# Patient Record
Sex: Male | Born: 1970 | Race: White | Hispanic: No | Marital: Married | Smoking: Former smoker
Health system: Southern US, Community
[De-identification: ages and names within clinical notes are randomized; demographics above are authoritative.]

## PROBLEM LIST (undated history)

## (undated) DIAGNOSIS — I77810 Thoracic aortic ectasia: Secondary | ICD-10-CM

## (undated) DIAGNOSIS — K219 Gastro-esophageal reflux disease without esophagitis: Secondary | ICD-10-CM

## (undated) DIAGNOSIS — I471 Supraventricular tachycardia, unspecified: Secondary | ICD-10-CM

## (undated) DIAGNOSIS — I10 Essential (primary) hypertension: Secondary | ICD-10-CM

## (undated) DIAGNOSIS — I2510 Atherosclerotic heart disease of native coronary artery without angina pectoris: Secondary | ICD-10-CM

## (undated) HISTORY — DX: Supraventricular tachycardia: I47.1

## (undated) HISTORY — DX: Thoracic aortic ectasia: I77.810

## (undated) HISTORY — DX: Supraventricular tachycardia, unspecified: I47.10

## (undated) HISTORY — DX: Atherosclerotic heart disease of native coronary artery without angina pectoris: I25.10

## (undated) HISTORY — DX: Essential (primary) hypertension: I10

## (undated) HISTORY — DX: Gastro-esophageal reflux disease without esophagitis: K21.9

---

## 2014-01-18 ENCOUNTER — Ambulatory Visit: Payer: Self-pay | Admitting: Physician Assistant

## 2015-02-21 ENCOUNTER — Encounter: Payer: Self-pay | Admitting: Family Medicine

## 2015-02-21 ENCOUNTER — Ambulatory Visit (INDEPENDENT_AMBULATORY_CARE_PROVIDER_SITE_OTHER): Admitting: Family Medicine

## 2015-02-21 VITALS — BP 128/88 | HR 73 | Temp 98.1°F | Resp 16 | Ht 68.0 in | Wt 237.0 lb

## 2015-02-21 DIAGNOSIS — K219 Gastro-esophageal reflux disease without esophagitis: Secondary | ICD-10-CM

## 2015-02-21 DIAGNOSIS — Z23 Encounter for immunization: Secondary | ICD-10-CM | POA: Diagnosis not present

## 2015-02-21 DIAGNOSIS — Z8679 Personal history of other diseases of the circulatory system: Secondary | ICD-10-CM | POA: Insufficient documentation

## 2015-02-21 DIAGNOSIS — J0111 Acute recurrent frontal sinusitis: Secondary | ICD-10-CM | POA: Diagnosis not present

## 2015-02-21 DIAGNOSIS — I471 Supraventricular tachycardia: Secondary | ICD-10-CM

## 2015-02-21 MED ORDER — BENZONATATE 100 MG PO CAPS: 100.0000 mg | ORAL_CAPSULE | Freq: Three times a day (TID) | ORAL | Status: DC | PRN

## 2015-02-21 MED ORDER — ESOMEPRAZOLE MAGNESIUM 40 MG PO CPDR: 40.0000 mg | DELAYED_RELEASE_CAPSULE | Freq: Two times a day (BID) | ORAL | Status: DC

## 2015-02-21 MED ORDER — AMOXICILLIN-POT CLAVULANATE 875-125 MG PO TABS: 1.0000 | ORAL_TABLET | Freq: Two times a day (BID) | ORAL | Status: DC

## 2015-02-21 NOTE — Progress Notes (Signed)
Subjective:    Patient ID: Damon Moore, male    DOB: Dec 10, 1970, 44 y.o.   MRN: [id number]  HPI: Damon Snooketer Crichlow is a 44 y.o. male presenting on 02/21/2015 for Sinusitis   HPI  Pt presents for sinus congestion. Symptoms began about 3 weeks ago. He was taking OTC medications- sudafed, cold medicine, afrin. 6 days ago symptoms began to interrupt his sleep and move to chest.  Worst symptom is head congestion. Pt is experiencing drainage coming from his throat causing him to cough.   Pt reports history of SVT. He is not able to take sudafed.   Desiring refills of GERD medication today. Occasional symptoms when not on medication. No N/V regurg or belching.   Past Medical History  Diagnosis Date  . GERD (gastroesophageal reflux disease)   . Paroxysmal SVT (supraventricular tachycardia) (HCC) 2008    No current outpatient prescriptions on file prior to visit.   No current facility-administered medications on file prior to visit.    Review of Systems  Constitutional: Negative for fever and chills.  HENT: Positive for congestion and sinus pressure. Negative for ear discharge, ear pain, facial swelling, rhinorrhea and sore throat.   Respiratory: Positive for cough and chest tightness. Negative for shortness of breath and wheezing.   Gastrointestinal: Positive for nausea. Negative for vomiting.  Musculoskeletal: Negative for myalgias and neck stiffness.  Neurological: Negative for dizziness and seizures.   Per HPI unless specifically indicated above     Objective:    BP 128/88 mmHg  Pulse 73  Temp(Src) 98.1 F (36.7 C) (Oral)  Resp 16  Ht 5\' 8"  (1.727 m)  Wt 237 lb (107.502 kg)  BMI 36.04 kg/m2  SpO2 97%  Wt Readings from Last 3 Encounters:  02/21/15 237 lb (107.502 kg)    Physical Exam  Constitutional: He appears well-developed and well-nourished. No distress.  HENT:  Head: Normocephalic and atraumatic.  Right Ear: Hearing and ear canal normal. Tympanic membrane is not  erythematous and not bulging.  Left Ear: Hearing and ear canal normal. Tympanic membrane is not erythematous and not bulging.  Nose: Mucosal edema and rhinorrhea present. No sinus tenderness or nasal septal hematoma. Right sinus exhibits maxillary sinus tenderness and frontal sinus tenderness. Left sinus exhibits no maxillary sinus tenderness and no frontal sinus tenderness.  Mouth/Throat: Uvula is midline and mucous membranes are normal. Uvula swelling present. Posterior oropharyngeal erythema present. No posterior oropharyngeal edema.  Fluid and airbubbles behind R TM.    Neck: Normal range of motion. Neck supple. No Brudzinski's sign and no Kernig's sign noted.  Cardiovascular: Normal rate, regular rhythm and normal heart sounds.   Pulmonary/Chest: Breath sounds normal. No accessory muscle usage. No tachypnea. No respiratory distress.  Lymphadenopathy:    He has no cervical adenopathy.   No results found for this or any previous visit.    Assessment & Plan:   Problem List Items Addressed This Visit      Cardiovascular and Mediastinum   Paroxysmal SVT (supraventricular tachycardia) (HCC)    Other Visit Diagnoses    Acute recurrent frontal sinusitis    -  Primary    Treat for sinus infection given duration of symptoms. Augmentin BID. Supportive care at home. Advised to avoid sudafed given history of SVT.     Relevant Medications    fluticasone (FLONASE) 50 MCG/ACT nasal spray    amoxicillin-clavulanate (AUGMENTIN) 875-125 MG tablet    benzonatate (TESSALON) 100 MG capsule    Need for influenza vaccination  Relevant Orders    Flu Vaccine QUAD 36+ mos PF IM (Fluarix & Fluzone Quad PF) (Completed)    Gastroesophageal reflux disease without esophagitis        Refills sent to pharmacy.     Relevant Medications    esomeprazole (NEXIUM) 40 MG capsule       Meds ordered this encounter  Medications  . fluticasone (FLONASE) 50 MCG/ACT nasal spray    Sig:   .  amoxicillin-clavulanate (AUGMENTIN) 875-125 MG tablet    Sig: Take 1 tablet by mouth 2 (two) times daily.    Dispense:  20 tablet    Refill:  0    Order Specific Question:  Supervising Provider    Answer:  Janeann Forehand [phone]  . benzonatate (TESSALON) 100 MG capsule    Sig: Take 1 capsule (100 mg total) by mouth 3 (three) times daily as needed for cough.    Dispense:  30 capsule    Refill:  0    Order Specific Question:  Supervising Provider    Answer:  Janeann Forehand [[id number]]  . DISCONTD: esomeprazole (NEXIUM) 40 MG capsule    Sig: Take 1 capsule (40 mg total) by mouth 2 (two) times daily before a meal.    Dispense:  180 capsule    Refill:  3    Order Specific Question:  Supervising Provider    Answer:  Janeann Forehand [phone]  . esomeprazole (NEXIUM) 40 MG capsule    Sig: Take 1 capsule (40 mg total) by mouth 2 (two) times daily before a meal.    Dispense:  180 capsule    Refill:  3    Order Specific Question:  Supervising Provider    Answer:  Janeann Forehand [[id number]]      Follow up plan: Return if symptoms worsen or fail to improve.

## 2015-02-21 NOTE — Patient Instructions (Addendum)
Your symptoms are consistent with a viral upper respiratory infection and sinus infection. Take augmentin twice daily for 10 days.   You can use supportive care at home to help with your symptoms. I can use Mucinex DM  to help break up the congestion and soothe your cough. You can takes this twice daily.  I have also sent tesslon perles to your pharmacy to help with the cough- you can take these 3 times daily as needed. Honey is a natural cough suppressant- so add it to your tea in the morning.  If you have a humidifer, set that up in your bedroom at night.   Saline spray with help irrigate your sinuses. Use afrin for 3 days and 3 days only.

## 2015-05-31 ENCOUNTER — Other Ambulatory Visit: Payer: Self-pay | Admitting: Family Medicine

## 2015-11-26 ENCOUNTER — Encounter: Payer: Self-pay | Admitting: Family Medicine

## 2017-01-16 ENCOUNTER — Ambulatory Visit (INDEPENDENT_AMBULATORY_CARE_PROVIDER_SITE_OTHER)

## 2017-01-16 DIAGNOSIS — Z23 Encounter for immunization: Secondary | ICD-10-CM | POA: Diagnosis not present

## 2017-10-20 ENCOUNTER — Ambulatory Visit (INDEPENDENT_AMBULATORY_CARE_PROVIDER_SITE_OTHER): Admitting: Family Medicine

## 2017-10-20 ENCOUNTER — Ambulatory Visit: Admitting: Nurse Practitioner

## 2017-10-20 ENCOUNTER — Other Ambulatory Visit: Payer: Self-pay | Admitting: Family Medicine

## 2017-10-20 ENCOUNTER — Encounter: Payer: Self-pay | Admitting: Family Medicine

## 2017-10-20 VITALS — BP 141/76 | HR 63 | Temp 98.1°F | Resp 16 | Ht 68.0 in | Wt 236.0 lb

## 2017-10-20 DIAGNOSIS — L308 Other specified dermatitis: Secondary | ICD-10-CM

## 2017-10-20 DIAGNOSIS — Z13220 Encounter for screening for lipoid disorders: Secondary | ICD-10-CM

## 2017-10-20 DIAGNOSIS — L570 Actinic keratosis: Secondary | ICD-10-CM

## 2017-10-20 DIAGNOSIS — R21 Rash and other nonspecific skin eruption: Secondary | ICD-10-CM

## 2017-10-20 DIAGNOSIS — K219 Gastro-esophageal reflux disease without esophagitis: Secondary | ICD-10-CM

## 2017-10-20 DIAGNOSIS — Z0000 Encounter for general adult medical examination without abnormal findings: Secondary | ICD-10-CM

## 2017-10-20 DIAGNOSIS — E669 Obesity, unspecified: Secondary | ICD-10-CM

## 2017-10-20 DIAGNOSIS — Z114 Encounter for screening for human immunodeficiency virus [HIV]: Secondary | ICD-10-CM

## 2017-10-20 DIAGNOSIS — L309 Dermatitis, unspecified: Secondary | ICD-10-CM | POA: Insufficient documentation

## 2017-10-20 DIAGNOSIS — Z131 Encounter for screening for diabetes mellitus: Secondary | ICD-10-CM

## 2017-10-20 MED ORDER — PANTOPRAZOLE SODIUM 40 MG PO TBEC: 40.0000 mg | DELAYED_RELEASE_TABLET | Freq: Two times a day (BID) | ORAL | 3 refills | Status: DC

## 2017-10-20 MED ORDER — CLOTRIMAZOLE-BETAMETHASONE 1-0.05 % EX CREA: 1.0000 "application " | TOPICAL_CREAM | Freq: Two times a day (BID) | CUTANEOUS | 2 refills | Status: DC

## 2017-10-20 NOTE — Assessment & Plan Note (Signed)
Suspected chronic GERD with known triggers - suboptimally controlled - No GI red flag symptoms. Exam is unremarkable with benign abdomen - History not suggestive of PUD but concern for other underlying factors that contribute to refractory symptoms for long-term - Previously better controlled on brand Nexium  Plan: 1. Discontinue esomeprazole - Start rx Pantoprazole 40mg  BID prior to meals 2. Diet modifications reduce GERD 3. Future consider carafate or other OTC antacid 4. Follow-up in future - advised he would benefit from further GI work-up in future back at [facility] if he prefers - may need EGD updated given chronicity and severity of problem

## 2017-10-20 NOTE — Assessment & Plan Note (Signed)
Consistent with moderate localized atopic dermatitis, eczema vs possible tinea cruris fungal rash on back given trigger w/ hot/moist environment - Uncomplicated, no evidence of superinfection or extensive body coverage. - Improved on Betamethasone in past  Plan: 1. Rx topical betamethasone + clotrimazole (Lotrisone) cream for back rash BID 7-10 days then PRN, if not as effective can notify office and we can switch to monotherapy betamethasone or other topical steroid 2. May use daily moisturizer BID 3. Counseled on routine eczema prevention/skin care per AVS 4. Return criteria - may establish with Dermatology in future

## 2017-10-20 NOTE — Patient Instructions (Addendum)
Thank you for coming to the office today.  For Acid Reflux - We will switch off of the Esomeprazole  Read about Actinic Keratoses  Recommend follow-up with Dermatology for likely freezing or cryotherapy for pre-cancer  --------------------------------  The rash looks most consistent with eczema, this can flare up and get worse due to a variety of factors (excessive dry skin from bathing/showering, soaps, cold weather / indoor heaters, outdoor exposures).  Use the topical steroid creams twice a day for up to 1 week, maximum duration of use per one flare is 10 to 14 days, then STOP using it and allow skin to recover. Caution with over-use may cause lightening of the skin.   Hydrocortisone on face only and the Triamcinolone / Kenalog on body only.  Tips to reduce Eczema Flares: For baths/showers, limit bathing to every other day if you can (max 1 x daily)  Use a gentle, unscented soap and lukewarm water (hot water is most irritating to skin) Never scrub skin with too much pressure, this causes more irritation. Pat skin dry, then leave it slightly damp. DO NOT scrub it dry. Apply steroid cream to skin and rub in all the way, wait 15 min, then apply a daily moisturizer (Vaseline, Eucerin, Aveeno). Continue daily moisturizer every day of the year (even after flare is resolved) - If you have eczema on hands or dry hands, recommend wearing any type of gloves overnight (cloth, fabric, or even nitrile/latex) to improve effect of topical moisturizer  We sent combo cream with anti fungal as well - follow instructions, call if not improving and need to switch to other med  If develops redness, honey colored crust oozing, drainage of pus, bleeding, or redness / swelling, pain, please return for re-evaluation, may have become infected after scratching.  ---------------------  Please call your Medicare Insurance Plan to speak with benefits and ask what the cost and coverage is for a "Annual Physical"  (performed by your doctor)  DUE for FASTING BLOOD WORK (no food or drink after midnight before the lab appointment, only water or coffee without cream/sugar on the morning of)  SCHEDULE "Lab Only" visit in the morning at the clinic for lab draw in 5 MONTHS   - Make sure Lab Only appointment is at about 1 week before your next appointment, so that results will be available  For Lab Results, once available within 2-3 days of blood draw, you can can log in to MyChart online to view your results and a brief explanation. Also, we can discuss results at next follow-up visit.   Please schedule a Follow-up Appointment to: Return in about 5 months (around 03/22/2018) for Annual Physical.  If you have any other questions or concerns, please feel free to call the office or send a message through MyChart. You may also schedule an earlier appointment if necessary.  Additionally, you may be receiving a survey about your experience at our office within a few days to 1 week by e-mail or mail. We value your feedback.  Saralyn PilarAlexander Rashun Grattan, DO [facility]

## 2017-10-20 NOTE — Assessment & Plan Note (Signed)
Consistent with AK on L upper helix ear, other areas likely face/scalp High risk patient with fair skin and inc sun exposure No prior known skin CA. But fam  History of father w/ skin cancer uncertain type Recommend that he follow-up with Dermatology - he states does not need referral - will benefit from skin surveillance and cryotherapy

## 2017-10-20 NOTE — Progress Notes (Signed)
Subjective:    Patient ID: Damon Snooketer Hensler, male    DOB: 1971-03-31, 47 y.o.   MRN: [id number]  Damon Moore is a 47 y.o. male presenting on 10/20/2017 for Gastroesophageal Reflux (needs refill) and heat rash (onset 3 weeks )  Patient previously followed by prior PCP Dr Juanetta GoslingHawkins here. Now he is re-establishing with me as new PCP. He also has received care at the [facility].  HPI  RASH, Back Here for recurrent chronic problem. Previously treated by prior PCP with topical Betamethasone cream on back with good results, usually problem recurs with dry itchy skin. He attributes to moist environment of skin on his back, he works as a Emergency planning/management officerpolice officer, [employer], he was wearing heavy vest recently and was sweating. Onset about 3 weeks now, not improving, worse with certain clothing, worse with sweating and different circumstance. Primarily itching, without pain He takes very hot showers usually and does scrub dry with towel after. Denies any fevers, chills, sweats, other rash or drainage  GERD Chronic problem, has been treated in past by [facility]. He was treated with Nexium for past 10 years. He was treated with PPI long term high dose. He was taking brand name Nexium 40mg  BID in past, now they do not cover it. [facility] had barium swallow unsure if he had EGD - He knows his trigger foods, and limits these factors mostly tomato and spicey foods. - He is interested to try other PPI at this time Denies abdominal pain, nausea vomiting, dark stool blood in stool  Additional complaint - Actinic Keratoses - reports concern with few spots on face and Left ear with rough patch that does not resolve present for long time. Asking for 2nd opinion and if need to go to Dermatology   [facility]/2017  Decreased Interest 0  Down, Depressed, Hopeless 0  PHQ - 2 Score 0    Social History   Tobacco Use  . Smoking status: Former Smoker    Last attempt to quit: 04/07/2009    Years  since quitting: 8.5  . Smokeless tobacco: Former Engineer, waterUser  Substance Use Topics  . Alcohol use: Yes    Comment: rare  . Drug use: No    Review of Systems Per HPI unless specifically indicated above     Objective:    BP (!) 141/76   Pulse 63   Temp 98.1 F (36.7 C) (Oral)   Resp 16   Ht 5\' 8"  (1.727 m)   Wt 236 lb (107 kg)   BMI 35.88 kg/m   Wt Readings from Last 3 Encounters:  10/20/17 236 lb (107 kg)  02/21/15 237 lb (107.5 kg)    Physical Exam  Constitutional: He is oriented to person, place, and time. He appears well-developed and well-nourished. No distress.  Well-appearing, comfortable, cooperative  HENT:  Head: Normocephalic and atraumatic.  Mouth/Throat: Oropharynx is clear and moist.  Eyes: Conjunctivae are normal. Right eye exhibits no discharge. Left eye exhibits no discharge.  Cardiovascular: Normal rate.  Pulmonary/Chest: Effort normal.  Musculoskeletal: He exhibits no edema.  Neurological: He is alert and oriented to person, place, and time.  Skin: Skin is warm and dry. Rash (see picture, extensive on back but mostly R sided upper fine rough erythematous maculopapular rash with scaley dry areas) noted. He is not diaphoretic. No erythema.  Left Ear - on top of helix has 1-2 cm area of very dry rough skin, without ulceration,  consistent with AK  Fair skin with evidence of sun exposure  Psychiatric: He has a normal mood and affect. His behavior is normal.  Well groomed, good eye contact, normal speech and thoughts  Nursing note and vitals reviewed.   Right upper back      No results found for this or any previous visit.    Assessment & Plan:   Problem List Items Addressed This Visit    AK (actinic keratosis)    Consistent with AK on L upper helix ear, other areas likely face/scalp High risk patient with fair skin and inc sun exposure No prior known skin CA. But fam  History of father w/ skin cancer uncertain type Recommend that he follow-up with  Dermatology - he states does not need referral - will benefit from skin surveillance and cryotherapy      Eczema    Consistent with moderate localized atopic dermatitis, eczema vs possible tinea cruris fungal rash on back given trigger w/ hot/moist environment - Uncomplicated, no evidence of superinfection or extensive body coverage. - Improved on Betamethasone in past  Plan: 1. Rx topical betamethasone + clotrimazole (Lotrisone) cream for back rash BID 7-10 days then PRN, if not as effective can notify office and we can switch to monotherapy betamethasone or other topical steroid 2. May use daily moisturizer BID 3. Counseled on routine eczema prevention/skin care per AVS 4. Return criteria - may establish with Dermatology in future      Relevant Medications   clotrimazole-betamethasone (LOTRISONE) cream   Gastroesophageal reflux disease - Primary    Suspected chronic GERD with known triggers - suboptimally controlled - No GI red flag symptoms. Exam is unremarkable with benign abdomen - History not suggestive of PUD but concern for other underlying factors that contribute to refractory symptoms for long-term - Previously better controlled on brand Nexium  Plan: 1. Discontinue esomeprazole - Start rx Pantoprazole 40mg  BID prior to meals 2. Diet modifications reduce GERD 3. Future consider carafate or other OTC antacid 4. Follow-up in future - advised he would benefit from further GI work-up in future back at [place] if he prefers - may need EGD updated given chronicity and severity of problem       Relevant Medications   pantoprazole (PROTONIX) 40 MG tablet    Other Visit Diagnoses    Rash and nonspecific skin eruption       Relevant Medications   clotrimazole-betamethasone (LOTRISONE) cream      Meds ordered this encounter  Medications  . pantoprazole (PROTONIX) 40 MG tablet    Sig: Take 1 tablet (40 mg total) by mouth 2 (two) times daily before a meal.    Dispense:  180  tablet    Refill:  3  . clotrimazole-betamethasone (LOTRISONE) cream    Sig: Apply 1 application topically 2 (two) times daily. For 7-10 days then as needed    Dispense:  45 g    Refill:  2    Follow up plan: Return in about 5 months (around 03/22/2018) for Annual Physical.  Future labs ordered.  Saralyn Pilar, DO [facility] 10/20/2017, 6:40 PM

## 2017-12-04 ENCOUNTER — Ambulatory Visit (INDEPENDENT_AMBULATORY_CARE_PROVIDER_SITE_OTHER)

## 2017-12-04 DIAGNOSIS — Z23 Encounter for immunization: Secondary | ICD-10-CM

## 2017-12-15 ENCOUNTER — Ambulatory Visit
Admission: RE | Admit: 2017-12-15 | Discharge: 2017-12-15 | Disposition: A | Discharge status: Home / Self Care | Source: Ambulatory Visit | Attending: Family Medicine | Admitting: Family Medicine

## 2017-12-15 ENCOUNTER — Ambulatory Visit (INDEPENDENT_AMBULATORY_CARE_PROVIDER_SITE_OTHER): Admitting: Family Medicine

## 2017-12-15 ENCOUNTER — Encounter: Payer: Self-pay | Admitting: Family Medicine

## 2017-12-15 VITALS — BP 134/97 | HR 98 | Temp 98.7°F | Resp 16 | Ht 68.0 in | Wt 230.0 lb

## 2017-12-15 DIAGNOSIS — M5442 Lumbago with sciatica, left side: Secondary | ICD-10-CM | POA: Diagnosis not present

## 2017-12-15 DIAGNOSIS — G8929 Other chronic pain: Secondary | ICD-10-CM | POA: Insufficient documentation

## 2017-12-15 DIAGNOSIS — M5136 Other intervertebral disc degeneration, lumbar region: Secondary | ICD-10-CM | POA: Insufficient documentation

## 2017-12-15 MED ORDER — BACLOFEN 10 MG PO TABS: 5.0000 mg | ORAL_TABLET | Freq: Three times a day (TID) | ORAL | 0 refills | Status: DC | PRN

## 2017-12-15 MED ORDER — PREDNISONE 10 MG PO TABS: ORAL_TABLET | ORAL | 0 refills | Status: DC

## 2017-12-15 MED ORDER — NAPROXEN 500 MG PO TABS: 500.0000 mg | ORAL_TABLET | Freq: Two times a day (BID) | ORAL | 0 refills | Status: DC

## 2017-12-15 NOTE — Progress Notes (Signed)
Subjective:    Patient ID: Damon Moore, male    DOB: 1971-03-07, 47 y.o.   MRN: [id number]  Leanord Moore is a 47 y.o. male presenting on 12/15/2017 for Back Pain (onset 2 days)  Patient presents for a same day appointment.  HPI   LOW BACK PAIN, Acute on Chronic - Reports symptoms started about 2 weeks ago without inciting injury but trigger with abnormal position on couch, noticed this after and thinks he affected his back - he wore back brace next day and it improved, then next onset of symptoms about 2 days ago another flare similar with bilateral low back pain - Today seems to be worsening with persistent pain. Describes pain as moderate to severe today, both sides of low back, rarely has he had any radiating pain down L leg occasional at night. He has continued to be active and was on feet all day today, does not help it. Worse if bending backwards. Affects his ambulation at times. Able to run without problem, but if walking or bending back it hurts more. 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 No prior back surgery - Admits some difficulty sleeping due to pain - used to improve on memory foam in the past - Denies any fevers/chills, numbness, tingling, weakness, loss of control bladder/bowel incontinence or retention, unintentional wt loss, night sweats  Additionally he has upcoming trip next week and will be out of town.  Depression screen [facility] 2/9 12/15/2017 10/20/2017  Decreased Interest 0 0  Down, Depressed, Hopeless 0 0  PHQ - 2 Score 0 0    Social  History   Tobacco Use  . Smoking status: Former Smoker    Last attempt to quit: 04/07/2009    Years since quitting: 8.6  . Smokeless tobacco: Former Engineer, water Use Topics  . Alcohol use: Yes    Comment: rare  . Drug use: No    Review of Systems Per HPI unless specifically indicated above     Objective:    BP (!) 134/97   Pulse 98   Temp 98.7 F (37.1 C) (Oral)   Resp 16   Ht 5\' 8"  (1.727 m)   Wt 230 lb (104.3 kg)   BMI 34.97 kg/m   Wt Readings from Last 3 Encounters:  12/15/17 230 lb (104.3 kg)  10/20/17 236 lb (107 kg)  02/21/15 237 lb (107.5 kg)    Physical Exam  Constitutional: He is oriented to person, place, and time. He appears well-developed and well-nourished. No distress.  Well-appearing, comfortable, cooperative  HENT:  Head: Normocephalic and atraumatic.  Eyes: Conjunctivae are normal. Right eye exhibits no discharge. Left eye exhibits no discharge.  Neck: Normal range of motion. Neck supple.  Cardiovascular: Regular rhythm, normal heart sounds and intact distal pulses.  No murmur heard. Mild tachycardia  Pulmonary/Chest: Effort normal.  Musculoskeletal: He exhibits no edema.  Low Back Inspection: Normal appearance, no spinal deformity, symmetrical. Palpation: No tenderness over spinous processes. Bilateral lower lumbar paraspinal muscles and SI region non reproducible  tenderness, but with some notable hypertonicity and spasm ROM: Full active ROM forward flex and rotation - but has notable pain / discomfort with back extension Special Testing: Seated SLR negative for radicular pain bilaterally  Standing facet load test not reproducible pain  Strength: Bilateral hip flex/ext 5/5, knee flex/ext 5/5, ankle dorsiflex/plantarflex 5/5 Neurovascular: intact distal sensation to light touch   Neurological: He is alert and oriented to person, place, and time.  Skin: Skin is warm and dry. No rash noted. He is not diaphoretic. No erythema.  Psychiatric: He  has a normal mood and affect. His behavior is normal.  Well groomed, good eye contact, normal speech and thoughts  Nursing note and vitals reviewed.  No results found for this or any previous visit.    Assessment & Plan:   Problem List Items Addressed This Visit    None    Visit Diagnoses    Chronic bilateral low back pain with left-sided sciatica    -  Primary   Relevant Medications   Ibuprofen (MOTRIN IB PO)   naproxen (NAPROSYN) 500 MG tablet   baclofen (LIORESAL) 10 MG tablet   predniSONE (DELTASONE) 10 MG tablet   Other Relevant Orders   DG Lumbar Spine Complete      Acute on chronic b/l LBP with associated rare L sciatica. Suspect likely due to muscle spasm/strain, without known injury or trauma, seems some overuse trigger. 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 - No red flag symptoms. Negative SLR for radiculopathy - Not responding to conservative therapy - some partial improvement on back brace  Plan: 1. Given chronicity of prior pain, and episodic flare ups - agree to proceed with imaging Lumbar X-ray today - pending result - Start prednisone dose pak taper over 60 to 10 mg in 6 days, hold all other oral NSAIDs while on prednisone, reviewed potential side effects - AFTER prednisone Start anti-inflammatory trial with rx Naprosyn 500mg  BID wc x 2-4 weeks, then PRN 2. Start muscle relaxant with Baclofen 10mg  tabs - take 5-10mg  up to TID PRN, titrate up as tolerated - caution sedation 3. May use Tylenol 1000mg  TID PRN for breakthrough 4. Encouraged use of heating pad 1-2x daily for now then PRN, back brace 5. Follow-up 4-6 weeks if not improved for re-evaluation, consider trial of PT, and possibly referral to Orthopedic   Meds ordered this encounter  Medications  . naproxen (NAPROSYN) 500 MG tablet    Sig: Take 1 tablet (500 mg total) by mouth 2 (two) times daily with a meal. START AFTER Prednisone - For 2-4 weeks then as needed    Dispense:  60  tablet    Refill:  0  . baclofen (LIORESAL) 10 MG tablet    Sig: Take 0.5-1 tablets (5-10 mg total) by mouth 3 (three) times daily as needed for muscle spasms. Prefer to take at bedtime only    Dispense:  30 each    Refill:  0  . predniSONE (DELTASONE) 10 MG tablet    Sig: Take 6 tabs with breakfast Day 1, 5 tabs Day 2, 4 tabs Day 3, 3 tabs Day 4, 2 tabs Day 5, 1 tab Day 6.    Dispense:  21 tablet    Refill:  0    Follow up plan: Return in about 4 weeks (around 01/12/2018), or if symptoms worsen or fail to improve, for Back Pain.  Saralyn Pilar, DO [facility] 12/15/2017, 5:50 PM

## 2017-12-15 NOTE — Patient Instructions (Addendum)
Thank you for coming to the office today.  1. For your Back Pain - I think that this is due to Muscle Spasms or strain. Your Sciatic Nerve can be affected causing some of your radiation and numbness down your legs.  X-ray today - stay tuned for results  Start Prednisone taper (steroid anti-inflammatory) for nerve irritation with pain in legs. Each pill is 10mg . Take 6 pills (60mg  daily) for 1 day at same time with breakfast, then each day reduce dose by 1 pill, so 5 pills, then 4, then 3, then 2 then 1 (last 6 days). Do not take any Ibuprofen or Motrin or Aleve while taking the Prednisone.  2. AFTER finish Prednisone - then START with anti-inflammatory Naprosyn (Naproxen) 500mg  twice daily (12 hrs apart, with food, breakfast and dinner) every day for next 2 to 4 weeks if helping, then can use only as needed  3. Anytime can go ahead and start Baclofen (Lioresal) 10mg  tablets - cut in half for 5mg  at night for muscle relaxant - may make you sedated or sleepy (be careful driving or working on this) if tolerated you can take every 8 hours, half or whole tab  4. May use Tylenol Extra Str 500mg  tabs - may take 1-2 tablets every 6 hours as needed  5. Recommend to start using heating pad on your lower back 1-2x daily for few weeks  6. Please be nice to [place] - due to his back pain - doctor's orders.  This pain may take weeks to months to fully resolve, but hopefully it will respond to the medicine initially. All back injuries (small or serious) are slow to heal since we use our back muscles every day. Be careful with turning, twisting, lifting, sitting / standing for prolonged periods, and avoid re-injury.  If your symptoms significantly worsen with more pain, or new symptoms with weakness in one or both legs, new or different shooting leg pains, numbness in legs or groin, loss of control or retention of urine or bowel movements, please call back for advice and you may need to go directly to the  Emergency Department.  Please schedule a Follow-up Appointment to: Return in about 4 weeks (around 01/12/2018), or if symptoms worsen or fail to improve, for Back Pain.  If you have any other questions or concerns, please feel free to call the office or send a message through MyChart. You may also schedule an earlier appointment if necessary.  Additionally, you may be receiving a survey about your experience at our office within a few days to 1 week by e-mail or mail. We value your feedback.  Saralyn Pilar, DO [facility], [place]

## 2018-03-16 ENCOUNTER — Other Ambulatory Visit: Payer: Self-pay | Admitting: Family Medicine

## 2018-03-16 DIAGNOSIS — M5442 Lumbago with sciatica, left side: Principal | ICD-10-CM

## 2018-03-16 DIAGNOSIS — G8929 Other chronic pain: Secondary | ICD-10-CM

## 2018-03-16 MED ORDER — NAPROXEN 500 MG PO TABS: 500.0000 mg | ORAL_TABLET | Freq: Two times a day (BID) | ORAL | 3 refills | Status: DC

## 2018-11-29 DIAGNOSIS — Z8249 Family history of ischemic heart disease and other diseases of the circulatory system: Secondary | ICD-10-CM | POA: Insufficient documentation

## 2018-11-29 NOTE — Progress Notes (Signed)
Cardiology Office Note  Date:  11/30/2018   ID:  Damon Moore, DOB 10/19/1970, MRN [id number]  PCP:  Olin Hauser, DO   Chief Complaint  Patient presents with  . New Patient (Initial Visit)    self referral for family hx MI. Meds reviewed verbally with patient.     HPI:  Damon Moore is a 48 yo male with PMH of  GERD Obesity Low back pain Smoker quit 2012,  Age 74 to 45 Remote history SVT on Sudafed, 2008 Self-referred for family history of MI  Occupation, Engineer, structural Very active at baseline No shortness of breath or chest pain with activity  No lab work available No testing Has not seen primary care in [place] for quite some years  Does not think he has diabetes Has never been told cholesterol is elevated  He is concerned about high blood pressure He has been monitoring blood pressure at home typically with diastolic pressures in the 90s  EKG personally reviewed by myself on todays visit Shows normal sinus rhythm rate 60 bpm nonspecific T wave abnormality inferior leads unable to exclude LVH  Very strong family history of coronary disease Father died 69 MI Uncle 30 died   PMH:   has no past medical history on file.  Hypertension Prior smoking history SVT  PSH:   History reviewed. No pertinent surgical history.  Current Outpatient Medications  Medication Sig Dispense Refill  . naproxen (NAPROSYN) 500 MG tablet Take 1 tablet (500 mg total) by mouth 2 (two) times daily with a meal. For up to 2-4 weeks only as needed 180 tablet 3  . pantoprazole (PROTONIX) 40 MG tablet Take 1 tablet (40 mg total) by mouth 2 (two) times daily before a meal. 180 tablet 3   No current facility-administered medications for this visit.      Allergies:   Patient has no known allergies.   Social History:  The patient  reports that he quit smoking about 9 years ago. He has quit using smokeless tobacco. He reports previous alcohol use. He reports that he does not use  drugs.   Family History:   family history includes Hearing loss in his paternal grandfather and paternal grandmother; Heart disease in his father.    Review of Systems: Review of Systems  Constitutional: Negative.   HENT: Negative.   Respiratory: Negative.   Cardiovascular: Negative.   Gastrointestinal: Negative.   Musculoskeletal: Negative.   Neurological: Negative.   Psychiatric/Behavioral: Negative.   All other systems reviewed and are negative.    PHYSICAL EXAM: VS:  BP (!) 140/94 (BP Location: Right Arm, Patient Position: Sitting, Cuff Size: Normal)   Pulse 60   Ht 5\' 8"  (1.727 m)   Wt 235 lb (106.6 kg)   BMI 35.73 kg/m  , BMI Body mass index is 35.73 kg/m. GEN: Well nourished, well developed, in no acute distress HEENT: normal Neck: no JVD, carotid bruits, or masses Cardiac: RRR; no murmurs, rubs, or gallops,no edema  Respiratory:  clear to auscultation bilaterally, normal work of breathing GI: soft, nontender, nondistended, + BS MS: no deformity or atrophy Skin: warm and dry, no rash Neuro:  Strength and sensation are intact Psych: euthymic mood, full affect   Recent Labs: No results found for requested labs within last 8760 hours.    Lipid Panel No results found for: CHOL, HDL, LDLCALC, TRIG    Wt Readings from Last 3 Encounters:  11/30/18 235 lb (106.6 kg)  12/15/17 230 lb (104.3 kg)  10/20/17 236 lb (107 kg)       ASSESSMENT AND PLAN:  Problem List Items Addressed This Visit      Cardiology Problems   SVT (supraventricular tachycardia) (HCC) - Primary     Other   Family history of early CAD     Family history early coronary disease Lab work drawn, lipids, hemoglobin A1c Very screening studies discussed with him, we have ordered a CT coronary calcium scoring for risk stratification Currently asymptomatic Prior smoking history 30 years  SVT No recent episodes  Hypertension Goal numbers discussed with him We will start losartan 50 mg  daily If blood pressure runs high we will increase up to 100 mg daily Lifestyle modification recommended   Disposition:   F/U as needed   Total encounter time more than 45 minutes  Greater than 50% was spent in counseling and coordination of care with the patient    Signed, Dossie Arbourim Rolene Andrades, M.D., Ph.D. [facility], [place] [phone]

## 2018-11-30 ENCOUNTER — Ambulatory Visit (INDEPENDENT_AMBULATORY_CARE_PROVIDER_SITE_OTHER): Admitting: Cardiovascular Disease

## 2018-11-30 ENCOUNTER — Encounter: Payer: Self-pay | Admitting: Cardiovascular Disease

## 2018-11-30 ENCOUNTER — Other Ambulatory Visit: Payer: Self-pay

## 2018-11-30 VITALS — BP 140/94 | HR 60 | Ht 68.0 in | Wt 235.0 lb

## 2018-11-30 DIAGNOSIS — I471 Supraventricular tachycardia: Secondary | ICD-10-CM

## 2018-11-30 DIAGNOSIS — Z8249 Family history of ischemic heart disease and other diseases of the circulatory system: Secondary | ICD-10-CM | POA: Diagnosis not present

## 2018-11-30 MED ORDER — LOSARTAN POTASSIUM 50 MG PO TABS: 50.0000 mg | ORAL_TABLET | Freq: Every day | ORAL | 3 refills | Status: DC

## 2018-11-30 NOTE — Patient Instructions (Addendum)
Please monitor blood pressure on losartan, see instructions below    Medication Instructions:  Your physician has recommended you make the following change in your medication:  1. START Losartan 50 mg once daily  If pressures run greater than 140/90, call the office, We would increase losartan up to 100 mg daily   If you need a refill on your cardiac medications before your next appointment, please call your pharmacy.    Lab work: Lipids, CMP, HBA1c done today.    If you have labs (blood work) drawn today and your tests are completely normal, you will receive your results only by: Marland Kitchen. MyChart Message (if you have MyChart) OR . A paper copy in the mail If you have any lab test that is abnormal or we need to change your treatment, we will call you to review the results.   Testing/Procedures:  We will order CT coronary calcium score $150 Family History     Please call [phone] to schedule   [facility] [address] [place], [place] [address]   Follow-Up: At [facility], you and your health needs are our priority.  As part of our continuing mission to provide you with exceptional heart care, we have created designated Provider Care Teams.  These Care Teams include your primary Cardiologist (physician) and Advanced Practice Providers (APPs -  Physician Assistants and Nurse Practitioners) who all work together to provide you with the care you need, when you need it.  . You will need a follow up appointment as needed  . Providers on your designated Care Team:   . Nicolasa Duckinghristopher Berge, NP . Eula Listenyan Dunn, PA-C . Marisue IvanJacquelyn Visser, PA-C  Any Other Special Instructions Will Be Listed Below (If Applicable).  For educational health videos Log in to : www.myemmi.com Or : [email or web address], password : [email or web address]   How to Take Your Blood Pressure You can take your blood pressure at home with a machine. You may need to check your blood pressure at home:  To check if you  have high blood pressure (hypertension).  To check your blood pressure over time.  To make sure your blood pressure medicine is working. Supplies needed: You will need a blood pressure machine, or monitor. You can buy one at a drugstore or online. When choosing one:  Choose one with an arm cuff.  Choose one that wraps around your upper arm. Only one finger should fit between your arm and the cuff.  Do not choose one that measures your blood pressure from your wrist or finger. Your doctor can suggest a monitor. How to prepare Avoid these things for 30 minutes before checking your blood pressure:  Drinking caffeine.  Drinking alcohol.  Eating.  Smoking.  Exercising. Five minutes before checking your blood pressure:  Pee.  Sit in a dining chair. Avoid sitting in a soft couch or armchair.  Be quiet. Do not talk. How to take your blood pressure Follow the instructions that came with your machine. If you have a digital blood pressure monitor, these may be the instructions: 1. Sit up straight. 2. Place your feet on the floor. Do not cross your ankles or legs. 3. Rest your left arm at the level of your heart. You may rest it on a table, desk, or chair. 4. Pull up your shirt sleeve. 5. Wrap the blood pressure cuff around the upper part of your left arm. The cuff should be 1 inch (2.5 cm) above your elbow. It is best to  wrap the cuff around bare skin. 6. Fit the cuff snugly around your arm. You should be able to place only one finger between the cuff and your arm. 7. Put the cord inside the groove of your elbow. 8. Press the power button. 9. Sit quietly while the cuff fills with air and loses air. 10. Write down the numbers on the screen. 11. Wait 2-3 minutes and then repeat steps 1-10. What do the numbers mean? Two numbers make up your blood pressure. The first number is called systolic pressure. The second is called diastolic pressure. An example of a blood pressure reading is  "120 over 80" (or 120/80). If you are an adult and do not have a medical condition, use this guide to find out if your blood pressure is normal: Normal  First number: below 120.  Second number: below 80. Elevated  First number: 120-129.  Second number: below 80. Hypertension stage 1  First number: 130-139.  Second number: 80-89. Hypertension stage 2  First number: 140 or above.  Second number: 85 or above. Your blood pressure is above normal even if only the top or bottom number is above normal. Follow these instructions at home:  Check your blood pressure as often as your doctor tells you to.  Take your monitor to your next doctor's appointment. Your doctor will: ? Make sure you are using it correctly. ? Make sure it is working right.  Make sure you understand what your blood pressure numbers should be.  Tell your doctor if your medicines are causing side effects. Contact a doctor if:  Your blood pressure keeps being high. Get help right away if:  Your first blood pressure number is higher than 180.  Your second blood pressure number is higher than 120. This information is not intended to replace advice given to you by your health care provider. Make sure you discuss any questions you have with your health care provider. Document Released: 03/06/2008 Document Revised: 03/06/2017 Document Reviewed: 08/31/2015 Elsevier Patient Education  2020 [employer].  Blood Pressure Record Sheet To take your blood pressure, you will need a blood pressure machine. You can buy a blood pressure machine (blood pressure monitor) at your clinic, drug store, or online. When choosing one, consider:  An automatic monitor that has an arm cuff.  A cuff that wraps snugly around your upper arm. You should be able to fit only one finger between your arm and the cuff.  A device that stores blood pressure reading results.  Do not choose a monitor that measures your blood pressure from  your wrist or finger. Follow your health care provider's instructions for how to take your blood pressure. To use this form:  Get one reading in the morning (a.m.) before you take any medicines.  Get one reading in the evening (p.m.) before supper.  Take at least 2 readings with each blood pressure check. This makes sure the results are correct. Wait 1-2 minutes between measurements.  Write down the results in the spaces on this form.  Repeat this once a week, or as told by your health care provider.  Make a follow-up appointment with your health care provider to discuss the results. Blood pressure log Date: _______________________  a.m. _____________________(1st reading) _____________________(2nd reading)  p.m. _____________________(1st reading) _____________________(2nd reading) Date: _______________________  a.m. _____________________(1st reading) _____________________(2nd reading)  p.m. _____________________(1st reading) _____________________(2nd reading) Date: _______________________  a.m. _____________________(1st reading) _____________________(2nd reading)  p.m. _____________________(1st reading) _____________________(2nd reading) Date: _______________________  a.m. _____________________(1st reading)  _____________________(2nd reading)  p.m. _____________________(1st reading) _____________________(2nd reading) Date: _______________________  a.m. _____________________(1st reading) _____________________(2nd reading)  p.m. _____________________(1st reading) _____________________(2nd reading) This information is not intended to replace advice given to you by your health care provider. Make sure you discuss any questions you have with your health care provider. Document Released: 12/21/2002 Document Revised: 05/22/2017 Document Reviewed: 03/24/2017 Elsevier Patient Education  2020 [employer]

## 2018-12-01 LAB — COMPREHENSIVE METABOLIC PANEL
ALT: 20 IU/L (ref 0–44)
AST: 19 IU/L (ref 0–40)
Albumin/Globulin Ratio: 1.7 (ref 1.2–2.2)
Albumin: 4.4 g/dL (ref 4.0–5.0)
Alkaline Phosphatase: 69 IU/L (ref 39–117)
BUN/Creatinine Ratio: 30 — ABNORMAL HIGH (ref 9–20)
BUN: 23 mg/dL (ref 6–24)
Bilirubin Total: 0.5 mg/dL (ref 0.0–1.2)
CO2: 19 mmol/L — ABNORMAL LOW (ref 20–29)
Calcium: 9.2 mg/dL (ref 8.7–10.2)
Chloride: 105 mmol/L (ref 96–106)
Creatinine, Ser: 0.77 mg/dL (ref 0.76–1.27)
GFR calc Af Amer: 124 mL/min/{1.73_m2} (ref >59)
GFR calc non Af Amer: 107 mL/min/{1.73_m2} (ref >59)
Globulin, Total: 2.6 g/dL (ref 1.5–4.5)
Glucose: 110 mg/dL — ABNORMAL HIGH (ref 65–99)
Potassium: 4.3 mmol/L (ref 3.5–5.2)
Sodium: 140 mmol/L (ref 134–144)
Total Protein: 7 g/dL (ref 6.0–8.5)

## 2018-12-01 LAB — HEMOGLOBIN A1C
Est. average glucose Bld gHb Est-mCnc: 123 mg/dL
Hgb A1c MFr Bld: 5.9 % — ABNORMAL HIGH (ref 4.8–5.6)

## 2018-12-01 LAB — LIPID PANEL
Chol/HDL Ratio: 4.3 ratio (ref 0.0–5.0)
Cholesterol, Total: 243 mg/dL — ABNORMAL HIGH (ref 100–199)
HDL: 57 mg/dL (ref >39)
LDL Calculated: 167 mg/dL — ABNORMAL HIGH (ref 0–99)
Triglycerides: 96 mg/dL (ref 0–149)
VLDL Cholesterol Cal: 19 mg/dL (ref 5–40)

## 2019-01-10 ENCOUNTER — Other Ambulatory Visit: Payer: Self-pay

## 2019-01-10 ENCOUNTER — Ambulatory Visit (INDEPENDENT_AMBULATORY_CARE_PROVIDER_SITE_OTHER)
Admission: RE | Admit: 2019-01-10 | Discharge: 2019-01-10 | Disposition: A | Discharge status: Home / Self Care | Payer: Self-pay | Source: Ambulatory Visit | Attending: Cardiovascular Disease | Admitting: Cardiovascular Disease

## 2019-01-10 DIAGNOSIS — I471 Supraventricular tachycardia: Secondary | ICD-10-CM

## 2019-01-10 DIAGNOSIS — Z8249 Family history of ischemic heart disease and other diseases of the circulatory system: Secondary | ICD-10-CM

## 2019-01-18 ENCOUNTER — Telehealth: Payer: Self-pay | Admitting: Cardiovascular Disease

## 2019-01-18 NOTE — Telephone Encounter (Signed)
Notes recorded by Damon Merritts, MD on 01/17/2019 at 2:20 PM EDT  CT coronary calcium scoring  Score is very low at less than 1 which is very good news  1 or 2 tiny speckles that I could see on imaging  Given Smoking history and total cholesterol 240, it is very good result  With that said cholesterol is high and going forward this can be treated if he would like  This would involve taking low-dose generic cholesterol medication, for preventive   Other issue we have been watching his the blood pressure  Keep me posted if this is running high

## 2019-01-18 NOTE — Telephone Encounter (Signed)
Attempted to call the patient. No answer- I left a message to please call back.  

## 2019-01-19 MED ORDER — LOSARTAN POTASSIUM 100 MG PO TABS: ORAL_TABLET | ORAL | 3 refills | Status: DC

## 2019-01-19 MED ORDER — ROSUVASTATIN CALCIUM 5 MG PO TABS: ORAL_TABLET | ORAL | 3 refills | Status: DC

## 2019-01-19 NOTE — Telephone Encounter (Signed)
Crestor 5 mg daily Would increase losartan up to 100 mg daily, we can try to keep it down all the time

## 2019-01-19 NOTE — Telephone Encounter (Signed)
I called and spoke with the patient. I have advised him of Dr. Donivan Scull recommendations to: 1) start crestor 5 mg once daily 2) increase losartan to 100 mg once daily.  The patient is agreeable with both recommendations and voices understanding.  Both RX's have been updated at [facility].

## 2019-01-19 NOTE — Telephone Encounter (Signed)
I spoke with the patient regarding his CT Calcium Score and Dr. Donivan Scull recommendations.  The patient voices understanding. He is agreeable with starting a statin drug for prevention.  He has also been taking losartan 50 mg once daily. He is seeing his BP's average around 140's/90's. He checks this at various times during the day, but always after he has taken his losartan.  He did check his BP while on the phone with me and got a reading of 131/78. He couldn't believe it was this low so he took it took it again. BP (HR)- 128/80 (70).  He is aware I will review with Dr. Rockey Situ: 1) what statin to try (he would like this sent to [facility]) 2) any further recommendations for his BP  I have advised we will call back with further MD recommendations.  He is agreeable.

## 2019-07-05 ENCOUNTER — Other Ambulatory Visit: Payer: Self-pay | Admitting: Family Medicine

## 2019-07-05 DIAGNOSIS — G8929 Other chronic pain: Secondary | ICD-10-CM

## 2019-11-18 ENCOUNTER — Encounter: Payer: Self-pay | Admitting: Family Medicine

## 2019-11-18 ENCOUNTER — Ambulatory Visit (INDEPENDENT_AMBULATORY_CARE_PROVIDER_SITE_OTHER): Admitting: Family Medicine

## 2019-11-18 ENCOUNTER — Other Ambulatory Visit: Payer: Self-pay

## 2019-11-18 VITALS — BP 116/74 | HR 73 | Temp 97.5°F | Resp 16 | Ht 68.0 in | Wt 239.0 lb

## 2019-11-18 DIAGNOSIS — E669 Obesity, unspecified: Secondary | ICD-10-CM

## 2019-11-18 DIAGNOSIS — K219 Gastro-esophageal reflux disease without esophagitis: Secondary | ICD-10-CM | POA: Diagnosis not present

## 2019-11-18 MED ORDER — PANTOPRAZOLE SODIUM 40 MG PO TBEC: 40.0000 mg | DELAYED_RELEASE_TABLET | Freq: Two times a day (BID) | ORAL | 3 refills | Status: AC

## 2019-11-18 NOTE — Progress Notes (Signed)
Subjective:    Patient ID: Damon Moore, male    DOB: 26-Feb-1971, 49 y.o.   MRN: [id number]  Damon Moore is a 49 y.o. male presenting on 11/18/2019 for Gastroesophageal Reflux    HPI   GERD Chronic problem, has been treated in past by [place] with Nexium twice a day with good results, then it was not covered, switched to Pantoprazole. Prior history barium swallow in past. And other investigation but has not had other issues. - he is doing well on current pantoprazole '40mg'$  BID dosing, due for refill today. - he has no side effects or complaints - He knows his trigger foods, and limits these factors mostly tomato and spicey foods. Denies abdominal pain, nausea vomiting, dark stool blood in stool  Muscle cramping Reports bilateral lower extremity usually calf cramps at night and some sweat episode, he takes a K pill supplement OTC that resolves it PRN. He admits poor hydration often will get dehydrated if very active, and takes coffee and caffeine says this may be impacting it as well. His wife wants him to ask about options for this he feels comfortable otherwise. Prior blood work in 2019  Health Maintenance: Declines COVID19 vaccine and Flu Vaccine despite counseling He is due for physical and labs will return in 2022 after he turns 50+, he will discuss prostate CA screening / Colon screening at that time, he is interested in Cologuard, would like more information  Depression screen [facility] 2/9 11/18/2019 12/15/2017 10/20/2017  Decreased Interest 0 0 0  Down, Depressed, Hopeless 0 0 0  PHQ - 2 Score 0 0 0    Social History   Tobacco Use   Smoking status: Former Smoker    Quit date: 04/07/2009    Years since quitting: 10.6   Smokeless tobacco: Former Systems developer   Tobacco comment: Quit 2012  Substance Use Topics   Alcohol use: Not Currently   Drug use: No    Review of Systems Per HPI unless specifically indicated above     Objective:    BP 116/74    Pulse 73    Temp (!) 97.5 F  (36.4 C) (Temporal)    Resp 16    Ht '5\' 8"'$  (1.727 m)    Wt 239 lb (108.4 kg)    SpO2 96%    BMI 36.34 kg/m   Wt Readings from Last 3 Encounters:  11/18/19 239 lb (108.4 kg)  11/30/18 235 lb (106.6 kg)  12/15/17 230 lb (104.3 kg)    Physical Exam Vitals and nursing note reviewed.  Constitutional:      General: He is not in acute distress.    Appearance: He is well-developed. He is not diaphoretic.     Comments: Well-appearing, comfortable, cooperative  HENT:     Head: Normocephalic and atraumatic.  Eyes:     General:        Right eye: No discharge.        Left eye: No discharge.     Conjunctiva/sclera: Conjunctivae normal.  Cardiovascular:     Rate and Rhythm: Normal rate.  Pulmonary:     Effort: Pulmonary effort is normal.  Skin:    General: Skin is warm and dry.     Findings: No erythema or rash.  Neurological:     Mental Status: He is alert and oriented to person, place, and time.  Psychiatric:        Behavior: Behavior normal.     Comments: Well groomed, good eye contact, normal speech  and thoughts    Results for orders placed or performed in visit on 11/30/18  Lipid panel  Result Value Ref Range   Cholesterol, Total 243 (H) 100 - 199 mg/dL   Triglycerides 96 0 - 149 mg/dL   HDL 57 >39 mg/dL   VLDL Cholesterol Cal 19 5 - 40 mg/dL   LDL Calculated 167 (H) 0 - 99 mg/dL   Chol/HDL Ratio 4.3 0.0 - 5.0 ratio  Comp Met (CMET)  Result Value Ref Range   Glucose 110 (H) 65 - 99 mg/dL   BUN 23 6 - 24 mg/dL   Creatinine, Ser 0.77 0.76 - 1.27 mg/dL   GFR calc non Af Amer 107 >59 mL/min/1.73   GFR calc Af Amer 124 >59 mL/min/1.73   BUN/Creatinine Ratio 30 (H) 9 - 20   Sodium 140 134 - 144 mmol/L   Potassium 4.3 3.5 - 5.2 mmol/L   Chloride 105 96 - 106 mmol/L   CO2 19 (L) 20 - 29 mmol/L   Calcium 9.2 8.7 - 10.2 mg/dL   Total Protein 7.0 6.0 - 8.5 g/dL   Albumin 4.4 4.0 - 5.0 g/dL   Globulin, Total 2.6 1.5 - 4.5 g/dL   Albumin/Globulin Ratio 1.7 1.2 - 2.2   Bilirubin  Total 0.5 0.0 - 1.2 mg/dL   Alkaline Phosphatase 69 39 - 117 IU/L   AST 19 0 - 40 IU/L   ALT 20 0 - 44 IU/L  HgB A1c  Result Value Ref Range   Hgb A1c MFr Bld 5.9 (H) 4.8 - 5.6 %   Est. average glucose Bld gHb Est-mCnc 123 mg/dL      Assessment & Plan:   Problem List Items Addressed This Visit    Gastroesophageal reflux disease - Primary    Stable chronic controlled GERD on high dose PPI Prior work up with [facility] specialist, no recent EGD procedure.  Plan Agree to refill pantoprazole '40mg'$  BID dosing up to 90 day and 3 refills Will return for future physical and labs next year      Relevant Medications   pantoprazole (PROTONIX) 40 MG tablet    Other Visit Diagnoses    Obesity (BMI 35.0-39.9 without comorbidity)          Meds ordered this encounter  Medications   pantoprazole (PROTONIX) 40 MG tablet    Sig: Take 1 tablet (40 mg total) by mouth 2 (two) times daily before a meal.    Dispense:  180 tablet    Refill:  3      Follow up plan: Return in about 6 months (around 05/20/2020) for In 6 months, need Annual Physical.  Future lab orders to be placed when patient notifies me he is scheduled. Will add CMET LIPID CBC A1C PSA  Nobie Putnam, DO [facility] 11/18/2019, 12:01 PM

## 2019-11-18 NOTE — Patient Instructions (Addendum)
Thank you for coming to the office today.  Refilled the Pantoprazole for 1 year  Leg cramps - Try spoonful of yellow mustard to relieve leg cramps or try daily to prevent the problem  - OTC natural option is Hyland's Leg Cramps (Dissolving tablet) take as needed for muscle cramps  -----------------------------  Message me when ready to schedule.   Colon Cancer Screening: - For all adults age 49+ routine colon cancer screening is highly recommended.     - Recent guidelines from [place] recommend starting age of 61 - Early detection of colon cancer is important, because often there are no warning signs or symptoms, also if found early usually it can be cured. Late stage is hard to treat.  - If you are not interested in Colonoscopy screening (if done and normal you could be cleared for 5 to 10 years until next due), then Cologuard is an excellent alternative for screening test for Colon Cancer. It is highly sensitive for detecting DNA of colon cancer from even the earliest stages. Also, there is NO bowel prep required. - If Cologuard is NEGATIVE, then it is good for 3 years before next due - If Cologuard is POSITIVE, then it is strongly advised to get a Colonoscopy, which allows the GI doctor to locate the source of the cancer or polyp (even very early stage) and treat it by removing it. ------------------------- If you would like to proceed with Cologuard (stool DNA test) - FIRST, call your insurance company and tell them you want to check cost of Cologuard tell them CPT Code [phone] (it may be completely covered and you could get for no cost, OR max cost without any coverage is about $600). Also, keep in mind if you do NOT open the kit, and decide not to do the test, you will NOT be charged, you should contact the company if you decide not to do the test. - If you want to proceed, you can notify us (phone message, [place], or at next visit) and we will order it for you.  The test kit will be delivered to you house within about 1 week. Follow instructions to collect sample, you may call the company for any help or questions, 24/7 telephone support at [phone].    DUE for FASTING BLOOD WORK (no food or drink after midnight before the lab appointment, only water or coffee without cream/sugar on the morning of)  SCHEDULE "Lab Only" visit in the morning at the clinic for lab draw [place]  - Make sure Lab Only appointment is at about 1 week before your next appointment, so that results will be available  For Lab Results, once available within 2-3 days of blood draw, you can can log in to MyChart online to view your results and a brief explanation. Also, we can discuss results at next follow-up visit.   Please schedule a Follow-up Appointment to: Return in about 6 months (around 05/20/2020) for In 6 months, need Annual Physical.  If you have any other questions or concerns, please feel free to call the office or send a message through [place]. You may also schedule an earlier appointment if necessary.  Additionally, you may be receiving a survey about your experience at our office within a few days to 1 week by e-mail or mail. We value your feedback.  Nobie Putnam, DO [place]

## 2019-11-18 NOTE — Assessment & Plan Note (Signed)
Stable chronic controlled GERD on high dose PPI [facility], no recent EGD procedure.  Plan Agree to refill pantoprazole 40mg  BID dosing up to 90 day and 3 refills Will return for future physical and labs next year

## 2019-11-23 IMAGING — CT CT HEART SCORING
2 series · 16 of 20 positions shown, 18 images · non-contrast
Comparison: None.
COMPARISON: None.

Addendum:
EXAM:
OVER-READ INTERPRETATION  CT CHEST

The following report is an over-read performed by radiologist Dr.
Jando Noda [REDACTED] on 01/10/2019. This over-read
does not include interpretation of cardiac or coronary anatomy or
pathology. The calcium score interpretation by the cardiologist is
attached.
CLINICAL DATA: Risk stratification
Coronary Calcium Score
TECHNIQUE: The patient was scanned on a Siemens Somatom 64 slice scanner. Axial
non-contrast 3 mm slices were carried out through the heart. The
data set was analyzed on a dedicated work station and scored using
the Agatson method.

[Series 3: casc 3.0 i36f 2 bestdiast 74 % · axial · 0.42mm/px · z∈[-329,-239]mm · 8 of 40 slices shown, 10 images]
[im 5/40  vessel]
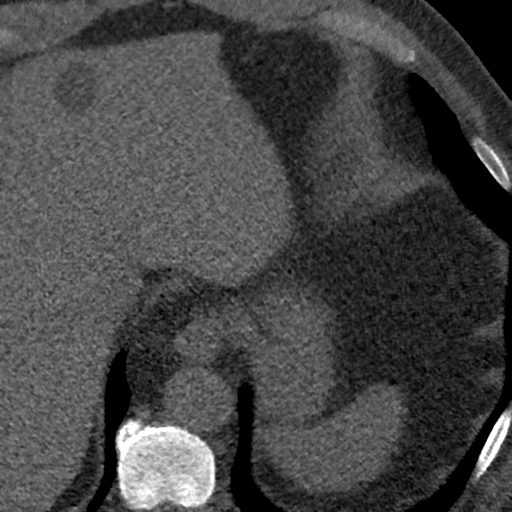
[im 5/40  lung]
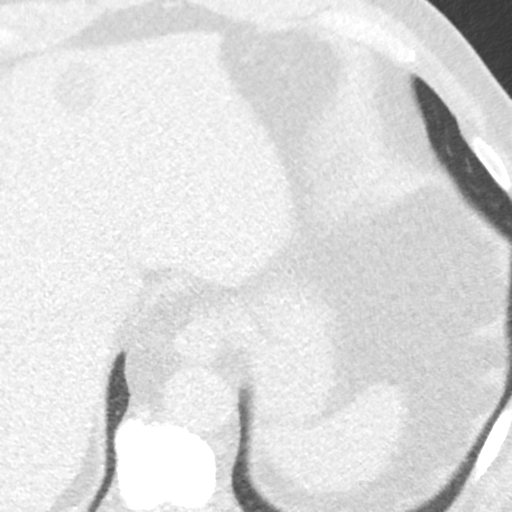
[im 9/40  vessel]
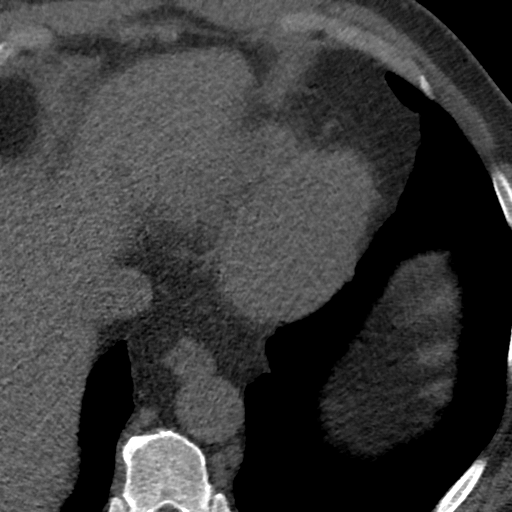
[im 14/40  vessel]
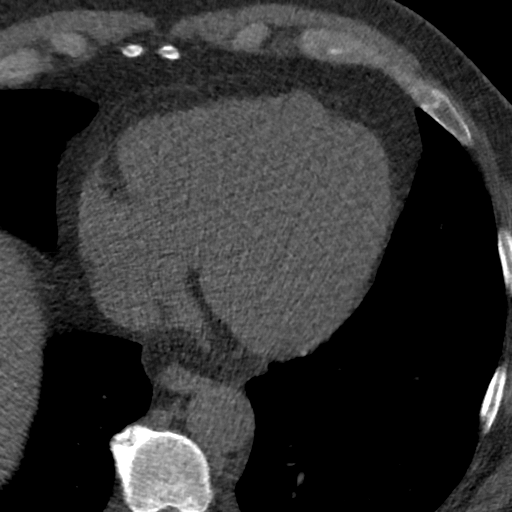
[im 18/40  vessel]
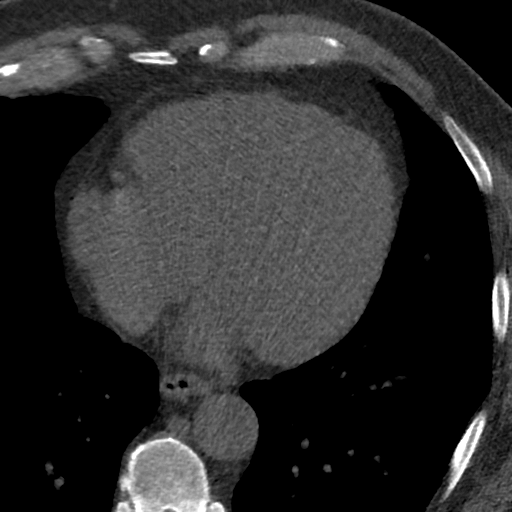
[im 22/40  vessel]
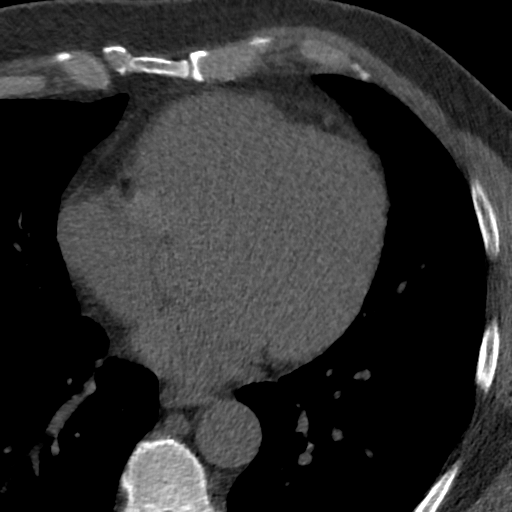
[im 22/40  lung]
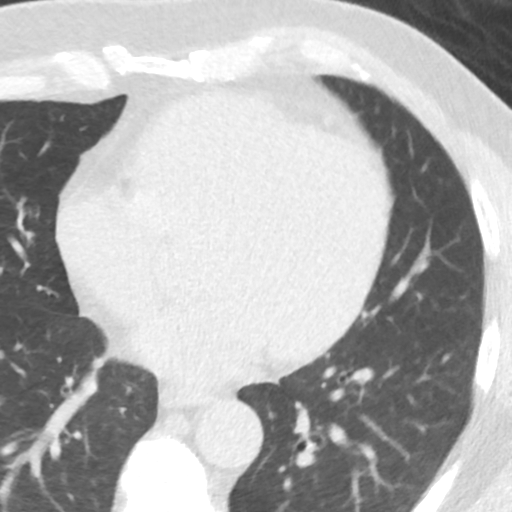
[im 27/40  vessel]
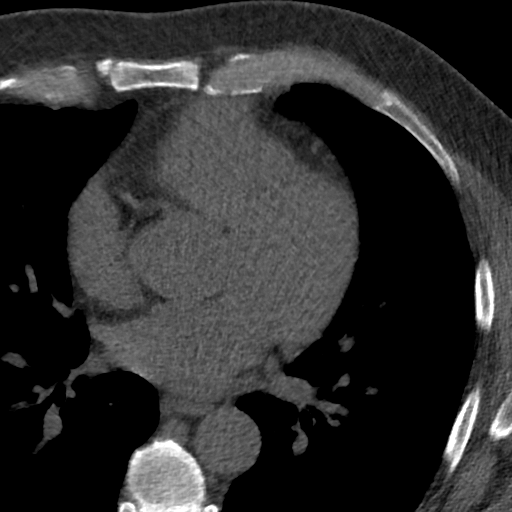
[im 31/40  vessel]
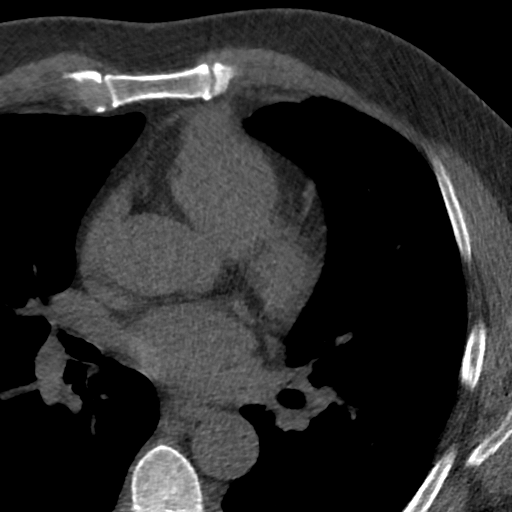
[im 35/40  vessel]
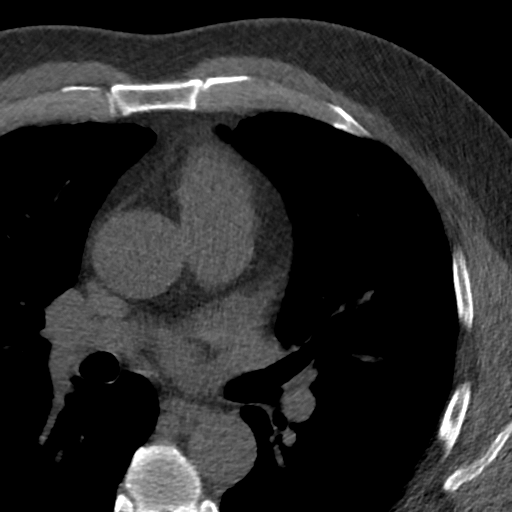

[Series 5: lung st 70 % · axial · 0.71mm/px · z∈[-329,-239]mm · 8 of 40 slices shown]
[im 5/40  lung]
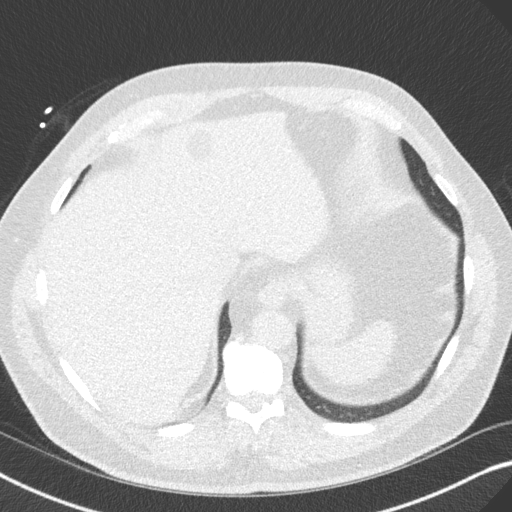
[im 9/40  lung]
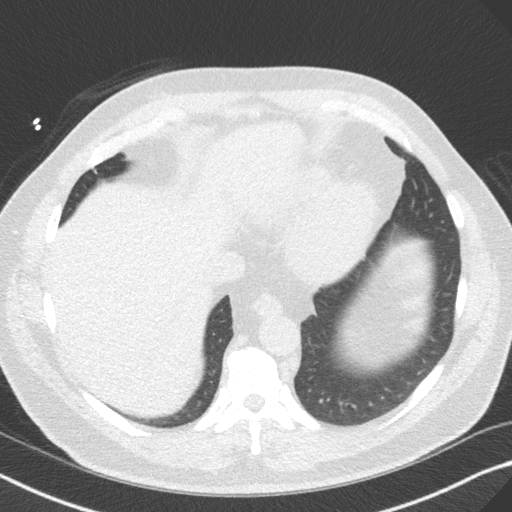
[im 14/40  lung]
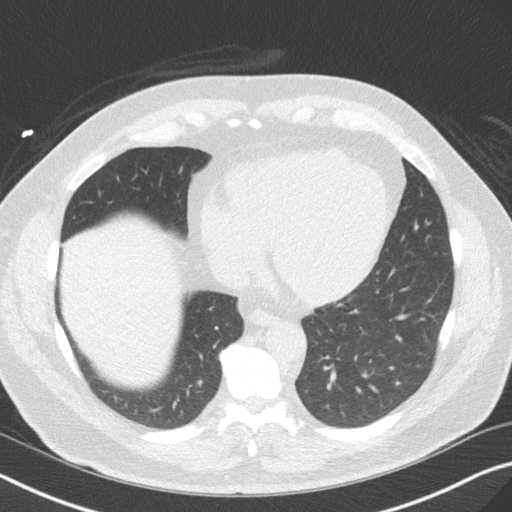
[im 18/40  lung]
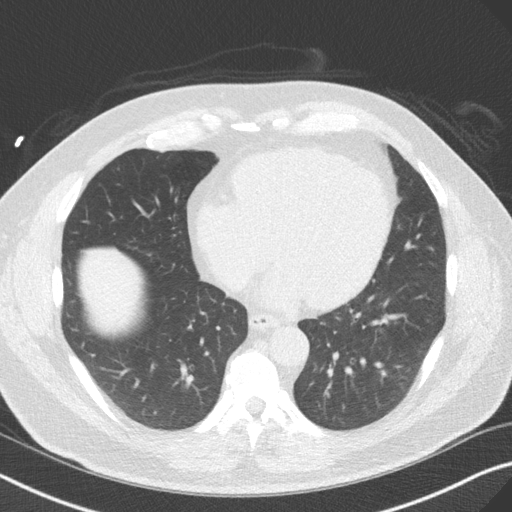
[im 22/40  lung]
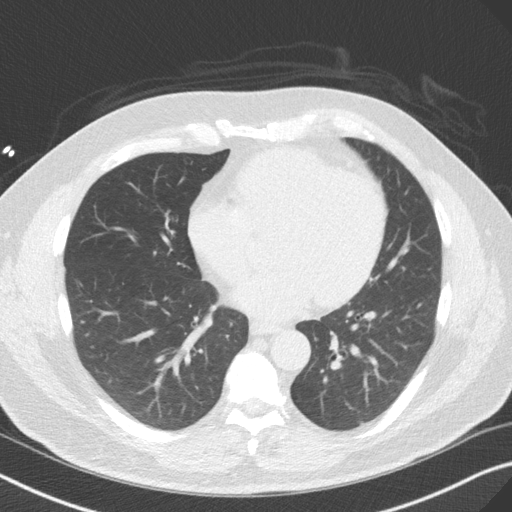
[im 27/40  lung]
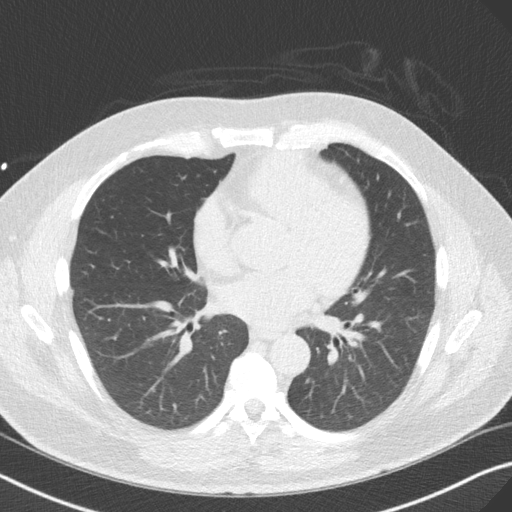
[im 31/40  lung]
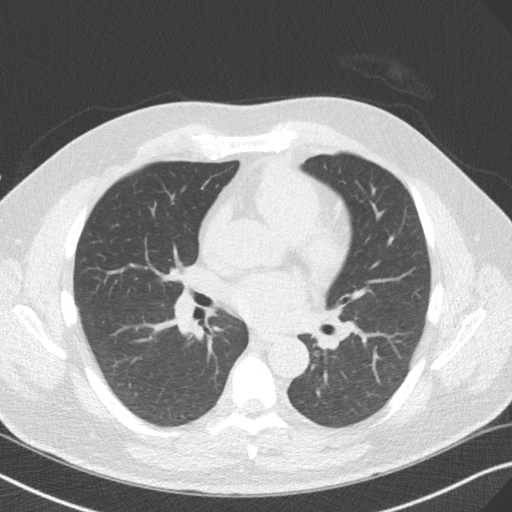
[im 35/40  lung]
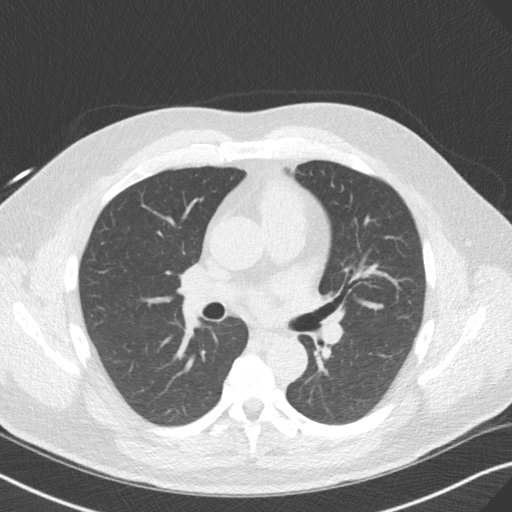

[16 of 20 positions shown; findings below may reference images not displayed]

FINDINGS: Vascular: Normal aortic caliber.

Mediastinum/Nodes: No imaged thoracic adenopathy.

Lungs/Pleura: No imaged pleural fluid. Clear imaged lungs. Mild
centrilobular emphysema.

Upper Abdomen: Left hepatic lobe 2.0 cm cyst. Normal imaged portions
of the spleen, stomach.

Musculoskeletal: No acute osseous abnormality.
IMPRESSION: No acute findings in the imaged extracardiac chest.

Emphysema (6ASOK-EDF.S).
FINDINGS: Non-cardiac: See separate report from [REDACTED].

Ascending aorta: Mildy dilated; 39 mm

Pericardium: Normal

Coronary arteries: Normal origin
IMPRESSION: Coronary calcium score of .712. This was 65 percentile for age and
sex matched control. Mildly dilated ascending aorta.

Jaylon Aujla

*** End of Addendum ***
EXAM:
OVER-READ INTERPRETATION  CT CHEST

The following report is an over-read performed by radiologist Dr.
Jando Noda [REDACTED] on 01/10/2019. This over-read
does not include interpretation of cardiac or coronary anatomy or
pathology. The calcium score interpretation by the cardiologist is
attached.
FINDINGS: Vascular: Normal aortic caliber.

Mediastinum/Nodes: No imaged thoracic adenopathy.

Lungs/Pleura: No imaged pleural fluid. Clear imaged lungs. Mild
centrilobular emphysema.

Upper Abdomen: Left hepatic lobe 2.0 cm cyst. Normal imaged portions
of the spleen, stomach.

Musculoskeletal: No acute osseous abnormality.
IMPRESSION: No acute findings in the imaged extracardiac chest.

Emphysema (6ASOK-EDF.S).

## 2019-12-27 ENCOUNTER — Other Ambulatory Visit: Payer: Self-pay | Admitting: Cardiovascular Disease

## 2020-03-08 ENCOUNTER — Other Ambulatory Visit: Payer: Self-pay | Admitting: Cardiovascular Disease

## 2020-03-08 ENCOUNTER — Telehealth: Payer: Self-pay | Admitting: Cardiovascular Disease

## 2020-03-08 NOTE — Telephone Encounter (Signed)
  Patient Consent for Virtual Visit         Damon Moore has provided verbal consent on 03/08/2020 for a virtual visit (video or telephone).   CONSENT FOR VIRTUAL VISIT FOR:  Damon Moore  By participating in this virtual visit I agree to the following:  I hereby voluntarily request, consent and authorize [facility] and its employed or contracted physicians, physician assistants, nurse practitioners or other licensed health care professionals (the Practitioner), to provide me with telemedicine health care services (the "Services") as deemed necessary by the treating Practitioner. I acknowledge and consent to receive the Services by the Practitioner via telemedicine. I understand that the telemedicine visit will involve communicating with the Practitioner through live audiovisual communication technology and the disclosure of certain medical information by electronic transmission. I acknowledge that I have been given the opportunity to request an in-person assessment or other available alternative prior to the telemedicine visit and am voluntarily participating in the telemedicine visit.  I understand that I have the right to withhold or withdraw my consent to the use of telemedicine in the course of my care at any time, without affecting my right to future care or treatment, and that the Practitioner or I may terminate the telemedicine visit at any time. I understand that I have the right to inspect all information obtained and/or recorded in the course of the telemedicine visit and may receive copies of available information for a reasonable fee.  I understand that some of the potential risks of receiving the Services via telemedicine include:  Marland Kitchen Delay or interruption in medical evaluation due to technological equipment failure or disruption; . Information transmitted may not be sufficient (e.g. poor resolution of images) to allow for appropriate medical decision making by the Practitioner;  and/or  . In rare instances, security protocols could fail, causing a breach of personal health information.  Furthermore, I acknowledge that it is my responsibility to provide information about my medical history, conditions and care that is complete and accurate to the best of my ability. I acknowledge that Practitioner's advice, recommendations, and/or decision may be based on factors not within their control, such as incomplete or inaccurate data provided by me or distortions of diagnostic images or specimens that may result from electronic transmissions. I understand that the practice of medicine is not an exact science and that Practitioner makes no warranties or guarantees regarding treatment outcomes. I acknowledge that a copy of this consent can be made available to me via my patient portal [facility] MyChart), or I can request a printed copy by calling the office of [facility].    I understand that my insurance will be billed for this visit.   I have read or had this consent read to me. . I understand the contents of this consent, which adequately explains the benefits and risks of the Services being provided via telemedicine.  . I have been provided ample opportunity to ask questions regarding this consent and the Services and have had my questions answered to my satisfaction. . I give my informed consent for the services to be provided through the use of telemedicine in my medical care

## 2020-03-08 NOTE — Telephone Encounter (Signed)
Please schedule office visit for refills. Thank you! 

## 2020-03-09 ENCOUNTER — Telehealth (INDEPENDENT_AMBULATORY_CARE_PROVIDER_SITE_OTHER): Admitting: Family

## 2020-03-09 ENCOUNTER — Other Ambulatory Visit: Payer: Self-pay

## 2020-03-09 ENCOUNTER — Encounter: Payer: Self-pay | Admitting: Family

## 2020-03-09 VITALS — Ht 68.0 in | Wt 220.0 lb

## 2020-03-09 DIAGNOSIS — I471 Supraventricular tachycardia: Secondary | ICD-10-CM

## 2020-03-09 DIAGNOSIS — I2510 Atherosclerotic heart disease of native coronary artery without angina pectoris: Secondary | ICD-10-CM | POA: Diagnosis not present

## 2020-03-09 DIAGNOSIS — I10 Essential (primary) hypertension: Secondary | ICD-10-CM

## 2020-03-09 DIAGNOSIS — E785 Hyperlipidemia, unspecified: Secondary | ICD-10-CM

## 2020-03-09 DIAGNOSIS — I77810 Thoracic aortic ectasia: Secondary | ICD-10-CM

## 2020-03-09 MED ORDER — ROSUVASTATIN CALCIUM 5 MG PO TABS: ORAL_TABLET | ORAL | 1 refills | Status: DC

## 2020-03-09 MED ORDER — LOSARTAN POTASSIUM 100 MG PO TABS: ORAL_TABLET | ORAL | 1 refills | Status: DC

## 2020-03-09 MED ORDER — ASPIRIN EC 81 MG PO TBEC: 81.0000 mg | DELAYED_RELEASE_TABLET | Freq: Every day | ORAL | 3 refills | Status: AC

## 2020-03-09 NOTE — Patient Instructions (Addendum)
Medication Instructions:   After review of chart on hanging up the phone, recommend starting Aspirin EC (enteric coated) 81mg  daily. This is to prevent the very mild amount of plaque noted in your heart arteries on CT scan last year from progressing. We have sent this to your pharmacy though you may also purchase over the counter.  We have sent a refill of Rosuvastatin (Crestor) and Losartan (Cozaar) to your pharmacy.   *If you need a refill on your cardiac medications before your next appointment, please call your pharmacy*   Lab Work: None ordered today. When you have lab work with Dr. in February, we recommend a CMP (complete metabolic panel) and fasting lipid panel (cholesterol).   This will let March assess your kidney and liver's response to medications as well as how much your cholesterol has improved since starting Rosuvastatin (Crestor).   Our goal is for your LDL or "lousy cholesterol" to be less than 70 (it was previously 167). If it is not at that goal, we will consider increasing your dose of Rosuvastatin (Crestor).   Testing/Procedures: None ordered today.   Follow-Up: At [facility], you and your health needs are our priority.  As part of our continuing mission to provide you with exceptional heart care, we have created designated Provider Care Teams.  These Care Teams include your primary Cardiologist (physician) and Advanced Practice Providers (APPs -  Physician Assistants and Nurse Practitioners) who all work together to provide you with the care you need, when you need it.  We recommend signing up for the patient portal called "MyChart".  Sign up information is provided on this After Visit Summary.  MyChart is used to connect with patients for Virtual Visits (Telemedicine).  Patients are able to view lab/test results, encounter notes, upcoming appointments, etc.  Non-urgent messages can be sent to your provider as well.   To learn more about what you can do with  MyChart, go to [facility].    Your next appointment:   1 year(s)  The format for your next appointment:   In Person  Provider:   You may see [email or web address], MD or one of the following Advanced Practice Providers on your designated Care Team:    Julien Nordmann, NP  Nicolasa Ducking, PA-C  Eula Listen, PA-C  Cadence Turn[place], [place]  [place], NP  Other Instructions  Hi Mr. Gorby,  Thank you for doing a virtual visit today. Keep up the good work eating a heart healthy diet and getting regular exercise! I recommend checking your blood pressure at least once per week and keeping a log. Our goal is for your blood pressure to be consistently less than 130/80. We do not worry about blood pressure being "too low" unless you are noticing symptoms of lightheadedness or dizziness. When checking blood pressure, it is best to sit and rest for 10 minutes with your feet flat on the floor prior to checking for most accurate measurement. Arm cuffs are much more accurate for blood pressure than wrist cuffs. If you have any questions or concerns do not hesitate to reach out to our office.   Best, Tomma Rakers, NP

## 2020-03-09 NOTE — Progress Notes (Signed)
Virtual Visit via Telephone Note   This visit type was conducted due to national recommendations for restrictions regarding the COVID-19 Pandemic (e.g. social distancing) in an effort to limit this patient's exposure and mitigate transmission in our community.  Due to his co-morbid illnesses, this patient is at least at moderate risk for complications without adequate follow up.  This format is felt to be most appropriate for this patient at this time.  The patient did not have access to video technology/had technical difficulties with video requiring transitioning to audio format only (telephone).  All issues noted in this document were discussed and addressed.  No physical exam could be performed with this format.  Please refer to the patient's chart for his  consent to telehealth for [facility].   Date:  03/09/2020   ID:  Damon Moore, DOB 03-20-1971, MRN [id number] The patient was identified using 2 identifiers.  Patient Location: Home Provider Location: Office/Clinic  PCP:  Smitty Cords, DO  Cardiologist:  Julien Nordmann, MD  Electrophysiologist:  None   Evaluation Performed:  Follow-Up Visit  Chief Complaint:  Follow up of SVT, HTN, HLD  History of Present Illness:    Damon Moore is a 49 y.o. male with HTN, GERD, obesity, prior tobacco use, remote SVT, mildly dilated ascending aorta (41mm on 01/2019) family history of coronary disease. He had previously coronary calcium score of 0.712 on 01/10/19 which placed him in the 65th percentile for age and sex matched control with mildly dilated ascending aorta   [name] reports feeling overall well.  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 Endorses eating a heart healthy diet at home. 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   Reports no shortness of breath nor dyspnea on exertion. Reports no chest pain, pressure, or tightness. No edema, orthopnea, PND. Reports no palpitations.   Not checking BP routinely at  home. Tells me his cuff is acting up and plans to purchase a new cuff. Discussed using arm cuff and BP goal <130/80. He is planning to do lab work with his primary care provider in February.   The patient does not have symptoms concerning for COVID-19 infection (fever, chills, cough, or new shortness of breath).    History reviewed. No pertinent past medical history. History reviewed. No pertinent surgical history.   Current Meds  Medication Sig  . losartan (COZAAR) 100 MG tablet TAKE 1 TABLET DAILY (DOSE INCREASE)  . pantoprazole (PROTONIX) 40 MG tablet Take 1 tablet (40 mg total) by mouth 2 (two) times daily before a meal.  . rosuvastatin (CRESTOR) 5 MG tablet TAKE 1 TABLET DAILY     Allergies:   Patient has no known allergies.   Social History   Tobacco Use  . Smoking status: Former Smoker    Quit date: 04/07/2009    Years since quitting: 10.9  . Smokeless tobacco: Former Neurosurgeon  . Tobacco comment: Quit 2012  Substance Use Topics  . Alcohol use: Not Currently  . Drug use: No    Family Hx: The patient's family history includes Hearing loss in his paternal grandfather and paternal grandmother; Heart disease in his father.  ROS:   Please see the history of present illness.    Review of Systems  Constitutional: Negative for chills, fever and malaise/fatigue.  Cardiovascular: Negative for chest pain, dyspnea on exertion, leg swelling, near-syncope, orthopnea, palpitations and syncope.  Respiratory: Negative for cough, shortness of breath and wheezing.   Gastrointestinal: Negative  for nausea and vomiting.  Neurological: Negative for dizziness, light-headedness and weakness.   All other systems reviewed and are negative.  Prior CV studies:   The following studies were reviewed today:  CT cardiac 01/10/19 IMPRESSION: Coronary calcium score of .712. This was 70 percentile for age and sex matched control. Mildly dilated ascending aorta.    Labs/Other Tests and Data Reviewed:      EKG:  No ECG reviewed.  Recent Labs: No results found for requested labs within last 8760 hours.   Recent Lipid Panel Lab Results  Component Value Date/Time   CHOL 243 (H) 11/30/2018 09:07 AM   TRIG 96 11/30/2018 09:07 AM   HDL 57 11/30/2018 09:07 AM   CHOLHDL 4.3 11/30/2018 09:07 AM   LDLCALC 167 (H) 11/30/2018 09:07 AM   Wt Readings from Last 3 Encounters:  03/09/20 220 lb (99.8 kg)  11/18/19 239 lb (108.4 kg)  11/30/18 235 lb (106.6 kg)    Objective:    Vital Signs:  Ht 5\' 8"  (1.727 m)   Wt 220 lb (99.8 kg)   BMI 33.45 kg/m    VITAL SIGNS:  reviewed  ASSESSMENT & PLAN:    1. HTN-not checking blood pressure routinely at home but agreeable to do so.  Blood pressure a/13/21 his primary care office well controlled.  Continue losartan 100 mg daily.  Refill provided.  Plan for CMP for reassessment of renal function at upcoming primary care appointment 05/2019.  2. HLD-continue Crestor 5 mg daily.  Refill provided.  LDL goal less than 70 in the setting of coronary artery calcification.  Plan for repeat lipid panel and CMP at primary care follow-up in February.  3. Coronary calcification on CT scan-coronary calcium score 0.712 on 01/2019.  Continue statin.  Continue low-sodium, heartily diet.  Regular cardiovascular exercise encouraged.  Would benefit from addition of aspirin 81 mg daily for secondary prevention and will ask him to start.  4. Mildly dilated ascending aorta-39 mm by CT 01/2019.  Recommend optimal BP control.  Consider repeat imaging with echocardiogram for CT scan at follow-up.  5. SVT-remote history.  Denies recurrent palpitations.  6. GERD-continue to follow with PCP.    Time:   Today, I have spent 10 minutes with the patient with telehealth technology discussing the above problems.     Medication Adjustments/Labs and Tests Ordered: Current medicines are reviewed at length with the patient today.  Concerns regarding medicines are outlined above.    Tests Ordered: No orders of the defined types were placed in this encounter.  Medication Changes: No orders of the defined types were placed in this encounter.   Follow Up:  In Person in 1 year(s) Dr. 02/2019 or APP  Signed, Mariah Milling, NP  03/09/2020 8:19 AM    [facility]

## 2020-04-18 ENCOUNTER — Other Ambulatory Visit: Payer: Self-pay

## 2020-04-18 ENCOUNTER — Telehealth (INDEPENDENT_AMBULATORY_CARE_PROVIDER_SITE_OTHER): Admitting: Family Medicine

## 2020-04-18 ENCOUNTER — Encounter: Payer: Self-pay | Admitting: Family Medicine

## 2020-04-18 VITALS — Wt 220.0 lb

## 2020-04-18 DIAGNOSIS — J0110 Acute frontal sinusitis, unspecified: Secondary | ICD-10-CM

## 2020-04-18 MED ORDER — AMOXICILLIN-POT CLAVULANATE 875-125 MG PO TABS: 1.0000 | ORAL_TABLET | Freq: Two times a day (BID) | ORAL | 0 refills | Status: DC

## 2020-04-18 NOTE — Progress Notes (Signed)
Virtual Visit via Telephone The purpose of this virtual visit is to provide medical care while limiting exposure to the novel coronavirus (COVID19) for both patient and office staff.  Consent was obtained for phone visit:  Yes.   Answered questions that patient had about telehealth interaction:  Yes.   I discussed the limitations, risks, security and privacy concerns of performing an evaluation and management service by telephone. I also discussed with the patient that there may be a patient responsible charge related to this service. The patient expressed understanding and agreed to proceed.  Patient Location: Home Provider Location: [facility] (Office)  Participants in virtual visit: - Patient: Damon Moore - CMA: Burnell Blanks, CMA - Provider: Dr Althea Charon  ---------------------------------------------------------------------- Chief Complaint  Patient presents with  . Nasal Congestion    S: Reviewed CMA documentation. I have called patient and gathered additional HPI as follows:  Sinusitis, acute Reports that symptoms started few weeks ago, with episodic flare, recently worse with sore throat cough past few days. - Tried OTC regimen for sinuses, decongestant  Denies any high risk travel to areas of current concern for COVID19. Denies any known or suspected exposure to person with or possibly with COVID19.  Denies any fevers, chills, sweats, body ache, cough, shortness of breath, headache, abdominal pain, diarrhea  Past Medical History:  Diagnosis Date  . Coronary artery calcification   . Coronary artery calcification   . GERD (gastroesophageal reflux disease)   . HTN (hypertension)   . Mild dilation of ascending aorta (HCC)   . SVT (supraventricular tachycardia) (HCC)    Social History   Tobacco Use  . Smoking status: Former Smoker    Quit date: 04/07/2009    Years since quitting: 11.0  . Smokeless tobacco: Former Neurosurgeon  . Tobacco comment: Quit  2012  Substance Use Topics  . Alcohol use: Not Currently  . Drug use: No    Current Outpatient Medications:  .  amoxicillin-clavulanate (AUGMENTIN) 875-125 MG tablet, Take 1 tablet by mouth 2 (two) times daily. For 10 days, Disp: 20 tablet, Rfl: 0 .  aspirin EC 81 MG tablet, Take 1 tablet (81 mg total) by mouth daily. Swallow whole., Disp: 90 tablet, Rfl: 3 .  losartan (COZAAR) 100 MG tablet, TAKE 1 TABLET DAILY (DOSE INCREASE), Disp: 90 tablet, Rfl: 1 .  pantoprazole (PROTONIX) 40 MG tablet, Take 1 tablet (40 mg total) by mouth 2 (two) times daily before a meal., Disp: 180 tablet, Rfl: 3 .  rosuvastatin (CRESTOR) 5 MG tablet, TAKE 1 TABLET DAILY, Disp: 90 tablet, Rfl: 1  Depression screen [facility] 2/9 11/18/2019 12/15/2017 10/20/2017  Decreased Interest 0 0 0  Down, Depressed, Hopeless 0 0 0  PHQ - 2 Score 0 0 0    No flowsheet data found.  -------------------------------------------------------------------------- O: No physical exam performed due to remote telephone encounter.  Lab results reviewed.  No results found for this or any previous visit (from the past 2160 hour(s)).  -------------------------------------------------------------------------- A&P:  Problem List Items Addressed This Visit   None   Visit Diagnoses    Acute non-recurrent frontal sinusitis    -  Primary   Relevant Medications   amoxicillin-clavulanate (AUGMENTIN) 875-125 MG tablet     Consistent with acute frontal rhinosinusitis, likely initially viral URI vs allergic rhinitis component with worsening concern for bacterial infection.   Unvaccinated to covid, declines test. Similar scenario for sinusitis with family member  Plan: 1 Start Augmentin 875-125mg  PO BID x 10 days 2.  May use nasal saline ./ flonase - caution Afrin use avoid dependence / rebound Return criteria reviewed   Meds ordered this encounter  Medications  . amoxicillin-clavulanate (AUGMENTIN) 875-125 MG tablet    Sig: Take 1 tablet  by mouth 2 (two) times daily. For 10 days    Dispense:  20 tablet    Refill:  0    Follow-up: - Return as needed if unresolved  Patient verbalizes understanding with the above medical recommendations including the limitation of remote medical advice.  Specific follow-up and call-back criteria were given for patient to follow-up or seek medical care more urgently if needed.   - Time spent in direct consultation with patient on phone: 8 minutes   Saralyn Pilar, DO [facility] 04/18/2020, 2:08 PM

## 2020-04-18 NOTE — Patient Instructions (Addendum)
1. It sounds like you have a Sinusitis (Bacterial Infection) - this most likely started as an Upper Respiratory Virus that has settled into an infection. Allergies can also cause this. - Start Augmentin 1 pill twice daily (breakfast and dinner, with food and plenty of water) for 10 days, complete entire course, do not stop early even if feeling better - Start Loratadine (Claritin) 10mg daily and Flonase 2 sprays in each nostril daily for next 4-6 weeks, then you may stop and use seasonally or as needed - Recommend keep using Nasal Saline spray multiple times a day to help flush out congestion and clear sinuses - Improve hydration by drinking plenty of clear fluids (water, gatorade) to reduce secretions and thin congestion - Congestion draining down throat can cause irritation. May try warm herbal tea with honey, cough drops - Can take Tylenol or Ibuprofen as needed for fevers - May continue over the counter cold medicine as you are, I would not use any decongestant or mucinex longer than 7 days.  If you develop persistent fever >101F for at least 3 consecutive days, headaches with sinus pain or pressure or persistent earache, please schedule a follow-up evaluation within next few days to week.  Please schedule a Follow-up Appointment to: Return if symptoms worsen or fail to improve.  If you have any other questions or concerns, please feel free to call the office or send a message through MyChart. You may also schedule an earlier appointment if necessary.  Additionally, you may be receiving a survey about your experience at our office within a few days to 1 week by e-mail or mail. We value your feedback.  Luisangel Wainright, DO [facility], [facility] 

## 2020-05-14 ENCOUNTER — Ambulatory Visit: Payer: Self-pay | Admitting: *Deleted

## 2020-05-14 NOTE — Telephone Encounter (Signed)
Summary: patient has lost voice   Patient has called in requesting some advice about further medical care  Patient has lost their voice and been unable to speak at full tone/volume for roughly 10 days  Patient is experiencing no other symptoms and felt "great" at the time of call with agent      Patient is calling to report he has had hoarseness not available Th/F for almost 2 weeks - patient states no other symptoms- his throat does hurt when he tries to talk- but otherwise- no pain. Office doe not have appointment available within disposition protocol- advised would send note for review and scheduling- patient states he has busy schedule at work- only available Tuesday am, Wed pm and not available Th/F. Reason for Disposition . Hoarseness persists > 2 weeks  Answer Assessment - Initial Assessment Questions 1. DESCRIPTION: "Describe your voice."     Very raspy voice 2. ONSET: "When did the hoarseness begin?"     1 1/2 weeks 3. COUGH: "Is there a cough?" If Yes, ask: "How bad?"     Slight cough 4. FEVER: "Do you have a fever?" If Yes, ask: "What is your temperature, how was it measured, and when did it start?"     no 5. RESPIRATORY STATUS: "Describe your breathing."      Perfect- no problems 6. ALLERGIES: "Any allergy symptoms?" If Yes, ask: "What are they?"     no 7. IRRITANTS: "Do you smoke?" "Have you been exposed to any irritating fumes?"     Quit smoking 10 years 8. CAUSE: "What do you think is causing the hoarseness?"     Not sure 9. OTHER SYMPTOMS: "Do you have any other symptoms?" (e.g., sore throat, swelling, foreign body, rash)     Sore throat 10. PREGNANCY: "Is there any chance you are pregnant?" "When was your last menstrual period?"       n/a  Protocols used: HOARSENESS-A-AH

## 2020-05-15 ENCOUNTER — Telehealth (INDEPENDENT_AMBULATORY_CARE_PROVIDER_SITE_OTHER): Admitting: Family Medicine

## 2020-05-15 ENCOUNTER — Encounter: Payer: Self-pay | Admitting: Family Medicine

## 2020-05-15 DIAGNOSIS — J029 Acute pharyngitis, unspecified: Secondary | ICD-10-CM | POA: Diagnosis not present

## 2020-05-15 DIAGNOSIS — R490 Dysphonia: Secondary | ICD-10-CM

## 2020-05-15 NOTE — Patient Instructions (Signed)
Your sore throat is likely from postnasal drip or mucus dripping down your throat from your nose.  Try honey, throat lozenges, warm teas/liquids/soups to help soothe your throat. I recommend resting your voice as much as you are able to help with hoarseness. If you are still having trouble after a few weeks, come in for an in-person appointment to be evaluated. Certainly, if you develop fevers, difficulties breathing or swallowing, seek medical care sooner.   Call or MyChart with questions. Take care! Dr. Linwood Dibbles

## 2020-05-15 NOTE — Progress Notes (Signed)
Virtual Visit via Video Note  I connected with Damon Moore on 05/15/20 at  9:00 AM EST by a video enabled telemedicine application and verified that I am speaking with the correct person using two identifiers.  Location: Patient: home Provider: [facility]   I discussed the limitations of evaluation and management by telemedicine and the availability of in person appointments. The patient expressed understanding and agreed to proceed.  History of Present Illness:  SORE THROAT - with hoarseness, sore throat, runny nose x2 weeks - recent viral URI, rx augmentin for sinusitis 1/12. Sinusitis improved but with persistent sore throat and hoarseness. Some mild productive cough and runny nose. 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 Speaks in full sentences, slightly hoarse. No respiratory distress.  Assessment and Plan:  Sore throat, hoarseness Likely 2/2 postnasal drip from recent sinusitis, exacerbated by frequent talking/yelling at work. Previously declined COVID testing, out of window for quarantine. Low likelihood of strep or other bacterial infection, recently finished augmentin. Offered tussionex but patient declines. Recommend continuing OTC symptom relief with honey, throat lozenges, vocal rest as able. If URI/sinusitis completely resolves with persistent hoarseness/sore throat, recommend in-person appointment for evaluation.      I discussed the assessment and treatment plan with the patient. The patient was provided an opportunity to ask questions and all were answered. The patient agreed with the plan and demonstrated an understanding of the instructions.   The patient was advised to call back or seek an in-person evaluation if the symptoms worsen or if the condition fails to improve as anticipated.  I provided 6 minutes of non-face-to-face time during this encounter.   Caro Laroche, DO

## 2020-08-20 ENCOUNTER — Ambulatory Visit: Attending: Unknown Physician Specialty | Admitting: Speech Pathology

## 2020-08-20 ENCOUNTER — Other Ambulatory Visit: Payer: Self-pay

## 2020-08-20 DIAGNOSIS — R490 Dysphonia: Secondary | ICD-10-CM | POA: Diagnosis present

## 2020-08-22 ENCOUNTER — Other Ambulatory Visit: Payer: Self-pay

## 2020-08-22 ENCOUNTER — Ambulatory Visit: Admitting: Speech Pathology

## 2020-08-22 ENCOUNTER — Encounter: Payer: Self-pay | Admitting: Speech Pathology

## 2020-08-22 DIAGNOSIS — R490 Dysphonia: Secondary | ICD-10-CM

## 2020-08-22 NOTE — Therapy (Addendum)
[facility] MAIN [facility] SERVICES [address] [place], [place], [address] Phone: [phone]   Fax:  [phone]  Speech Language Pathology Evaluation  Patient Details  Name: Damon Moore MRN: [id number] Date of Birth: 1970-12-09 Referring Provider (SLP): Linus Salmons   Encounter Date: 08/20/2020   End of Session - 08/22/20 0921    Visit Number 1    Number of Visits 17    Date for SLP Re-Evaluation 10/15/20    Authorization Type [employer]    Authorization Time Period 08/20/2020 thru 10/15/2020    Authorization - Visit Number 1    Progress Note Due on Visit 10    SLP Start Time 1400    SLP Stop Time  1500    SLP Time Calculation (min) 60 min    Activity Tolerance Patient tolerated treatment well           Past Medical History:  Diagnosis Date  . Coronary artery calcification   . Coronary artery calcification   . GERD (gastroesophageal reflux disease)   . HTN (hypertension)   . Mild dilation of ascending aorta (HCC)   . SVT (supraventricular tachycardia) (HCC)     History reviewed. No pertinent surgical history.  There were no vitals filed for this visit.   Subjective Assessment - 08/22/20 0903    Subjective Pt pleasant, good historian, "my voice hurts"    Currently in Pain? Other (Comment)   general ache             SLP Evaluation [facility] - 08/22/20 0903      SLP Visit Information   SLP Received On 08/20/20    Referring Provider (SLP) Linus Salmons    Onset Date 08/13/2020    Medical Diagnosis Dysphonia      Subjective   Patient/Family Stated Goal reduce throat pain      General Information   HPI Pt is a 50 year male who is referred for Voice Therapy by Erline Hau. Pt has history of reflux, currently taking Omeprazole QD. Laryngoscopy on 08/13/2020 revealed use of false cords to speak, some moderate vocal fold edema, and posterior glottic pachyedermy. 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    Mobility Status ambulatory      Balance Screen   Has the patient fallen in the past 6 months No    Has the patient had a decrease in activity level because of a fear of falling?  No    Is the patient reluctant to leave their home because of a fear of falling?  No      Prior Functional Status   Cognitive/Linguistic Baseline Within functional limits    Type of Home House     Lives With Spouse;Family    Available Support Family    Vocation Full time employment   Health visitor   Overall Cognitive Status Within Functional Limits for tasks assessed      Auditory Comprehension   Overall Auditory Comprehension Appears within functional limits for tasks assessed      Reading Comprehension   Reading Status Within funtional limits      Expression   Primary Mode of Expression Verbal      Verbal Expression   Overall Verbal Expression Appears within functional limits for tasks assessed      Written Expression   Dominant Hand Right  Oral Motor/Sensory Function   Overall Oral Motor/Sensory Function Appears within functional limits for tasks assessed      Motor Speech   Overall Motor Speech Impaired    Respiration Impaired    Level of Impairment Sentence    Phonation Hoarse;Breathy;Low vocal intensity    Resonance Within functional limits    Articulation Within functional limitis    Intelligibility Intelligible    Motor Planning Witnin functional limits    Motor Speech Errors Not applicable    Phonation Impaired    Vocal Abuses Habitual Cough/Throat Clear;Prolonged Vocal Use;Vocal Fold Dehydration;Other (comment)   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)   Tension Present Jaw;Neck;Shoulder    Volume Soft    Pitch Low      Standardized Assessments   Standardized Assessments  Other Assessment   VHI; Perceptual Voice Evaluation (see below)              Perceptual Voice Evaluation   Voice Case History    Health risks: MODERATE; past smoker, reflux, post-nasal drip, decreased intake of water < 8 oz; vocally abusive job    Characteristic voice use: excessive, loud, commanding    Environmental risks: dry environment, Patent examiner, Restaurant manager, fast food    Misuse: prolonged use, strain    Phonotraumatic behaviors: cough    Vocal characteristics: breathy, habitual low pitch       Objective Voice Measurements   Maximum phonation time for sustained "ah": 5 seconds   Average fundamental frequency during sustained "ah": 108 Hz (2 SD below average 145 Hz + 23 for gender)   Habitual pitch: 126 Hz   Highest dynamic pitch when altering pitch from a low note to a high note: 170 Hz   Lowest dynamic pitch when altering from a high note to a low note: 106 Hz   Highest dynamic pitch in conversational speech: monotone    Lowest dynamic pitch in conversational speech: monotone    Average time patient was able to sustain /s/: 11.45 seconds   Average time patient was able to sustain /z/: 13.1 seconds   s/z ratio : .88 (suggestive of dysfunction>1.0)       VHI Score:  The Voice Handicap Index is comprised of a series of questions to assess the patient's perception of their voice. It is designed to evaluate the emotional, physical and functional components of the voice problem. Pt's responses indicate a mild impact on his emotional, physical and functional abilities.                   SLP Education - 08/22/20 0920    Education Details results of voice evaluation, ST POC    Person(s) Educated Patient    Methods Explanation;Demonstration;Verbal cues;Handout    Comprehension Verbalized understanding;Need further instruction            SLP Short Term Goals - 08/22/20 0927      SLP SHORT TERM GOAL #1   Title Pt will complete HEP for breath support with minimal support.    Baseline new goal    Time 10     Period --   sessions   Status New      SLP SHORT TERM GOAL #2   Title Pt will use abdominal breathing > 90% accuracy in structure sentence level tasks.    Baseline new goal    Time 10    Period --   sessions   Status New  SLP Long Term Goals - 08/22/20 0930      SLP LONG TERM GOAL #1   Target Date 10/15/20      SLP LONG TERM GOAL #2   Title The patient will be independent for abdominal breathing and breath support exercises.    Baseline new goal    Time 8    Period Weeks    Status New    Target Date 10/15/20            Plan - 08/22/20 6389    Clinical Impression Statement Pt presents with moderate dysphonia c/b gravely vocal quality, reduced habitual pitch, vocal fatigue and pain, decreased pitch range and reduced volume. Pt states that he is not "bothered by" his voice but he would like to reduce vocal pain related to vocal abuse acquired within his career path. Skilled St is recommended to train pt in vocal hygiene, improve breath support for voice in order to improve vocal quality, endurance and quality of life.    Speech Therapy Frequency 2x / week    Duration 8 weeks    Treatment/Interventions SLP instruction and feedback;Patient/family education    Potential to Achieve Goals Fair    Potential Considerations Other (comment)   vocally abusive career   Consulted and Agree with Plan of Care Patient           Patient will benefit from skilled therapeutic intervention in order to improve the following deficits and impairments:   Dysphonia    Problem List Patient Active Problem List   Diagnosis Date Noted  . Family history of early CAD 11/29/2018  . Gastroesophageal reflux disease 10/20/2017  . Eczema 10/20/2017  . AK (actinic keratosis) 10/20/2017  . History of PSVT (paroxysmal supraventricular tachycardia) 02/21/2015   Marquice Uddin B. Dreama Saa M.S., CCC-SLP, [facility] Speech-Language Pathologist Rehabilitation Services Office [phone]  Reuel Derby 08/22/2020, 9:36 AM  [place] [facility] MAIN [facility] SERVICES [address] [place], [place], [address] Phone: [phone]   Fax:  [phone]  Name: Damon Moore MRN: [id number] Date of Birth: [birth year]

## 2020-08-24 NOTE — Therapy (Signed)
[facility] MAIN [facility] SERVICES [address] [place], [place], [address] Phone: [phone]   Fax:  [phone]  Speech Language Pathology Treatment  Patient Details  Name: Arek Spadafore MRN: [id number] Date of Birth: [birth year] Referring Provider (SLP): Linus Salmons   Encounter Date: 08/22/2020   End of Session - 08/24/20 1134    Visit Number 2    Number of Visits 17    Date for SLP Re-Evaluation 10/15/20    Authorization Type [employer]    Authorization Time Period 08/20/2020 thru 10/15/2020    Authorization - Visit Number 2    Progress Note Due on Visit 10    SLP Start Time 1000    SLP Stop Time  1100    SLP Time Calculation (min) 60 min    Activity Tolerance Patient tolerated treatment well           Past Medical History:  Diagnosis Date  . Coronary artery calcification   . Coronary artery calcification   . GERD (gastroesophageal reflux disease)   . HTN (hypertension)   . Mild dilation of ascending aorta (HCC)   . SVT (supraventricular tachycardia) (HCC)     No past surgical history on file.  There were no vitals filed for this visit.   Subjective Assessment - 08/24/20 1130    Subjective pt pleasant, reports that his voice is "sounding more normal"    Currently in Pain? No/denies                 ADULT SLP TREATMENT - 08/24/20 0001      Cognitive-Linquistic Treatment   Skilled Treatment Instructed in respiratory support specifically abdominal breathing; independent with return demonstration, instructed in respiratory coordination - maximal cues required during phrase and sentence level utterances; speaking fewer words on single breath - maximal cues to reduce number of spoken words from 10 to 6 per breath            SLP Education - 08/24/20 1134    Education Details abdominal breathing, respiratory coordination    Person(s) Educated Patient    Methods Explanation;Demonstration;Verbal cues;Handout;Tactile  cues    Comprehension Verbalized understanding;Returned demonstration;Need further instruction            SLP Short Term Goals - 08/22/20 6294      SLP SHORT TERM GOAL #1   Title Pt will complete HEP for breath support with minimal support.    Baseline new goal    Time 10    Period --   sessions   Status New      SLP SHORT TERM GOAL #2   Title Pt will use abdominal breathing > 90% accuracy in structure sentence level tasks.    Baseline new goal    Time 10    Period --   sessions   Status New            SLP Long Term Goals - 08/22/20 0930      SLP LONG TERM GOAL #1   Target Date 10/15/20      SLP LONG TERM GOAL #2   Title The patient will be independent for abdominal breathing and breath support exercises.    Baseline new goal    Time 8    Period Weeks    Status New    Target Date 10/15/20            Plan - 08/24/20 1143    Clinical Impression Statement Overall mild to moderate dysphonia today.  Pt demonstrates baseline understanding of utilizing abdominal breathing. While able to use abdominal breath support, he required maximal cues to coordinate respiratory support with initiation of phonation as he lets out air prior to speaking at the casual phrase level. He also attempts to speak at least 10 words per breath which results in using accessory muscle involvement. I recommend skilled ST to train pt in vocal hygiene, improve breath support for voice and reduce laryngeal tension in order to improve vocal quality, endurance, and quality of life.    Speech Therapy Frequency 2x / week    Duration 8 weeks    Treatment/Interventions SLP instruction and feedback;Patient/family education    Potential to Achieve Goals Fair    Potential Considerations Other (comment)   vocally abusive career   SLP Home Exercise Plan provided, see instruction section    Consulted and Agree with Plan of Care Patient           Patient will benefit from skilled therapeutic intervention in  order to improve the following deficits and impairments:   Dysphonia    Problem List Patient Active Problem List   Diagnosis Date Noted  . Family history of early CAD 11/29/2018  . Gastroesophageal reflux disease 10/20/2017  . Eczema 10/20/2017  . AK (actinic keratosis) 10/20/2017  . History of PSVT (paroxysmal supraventricular tachycardia) 02/21/2015   Lydell Moga B. Dreama Saa M.S., CCC-SLP, [facility] Speech-Language Pathologist Rehabilitation Services Office [phone]  Reuel Derby 08/24/2020, 11:45 AM  [place] [facility] MAIN [facility] SERVICES [address] [place], [place], [address] Phone: [phone]   Fax:  [phone]   Name: Jernard Reiber MRN: [id number] Date of Birth: [birth year]

## 2020-08-24 NOTE — Patient Instructions (Signed)
Abdominal breathing, speaking on top of his breath, taking thoughtful pauses when speaking

## 2020-08-28 ENCOUNTER — Ambulatory Visit: Admitting: Speech Pathology

## 2020-08-29 ENCOUNTER — Other Ambulatory Visit: Payer: Self-pay

## 2020-08-29 ENCOUNTER — Ambulatory Visit: Admitting: Speech Pathology

## 2020-08-29 DIAGNOSIS — R490 Dysphonia: Secondary | ICD-10-CM

## 2020-08-30 NOTE — Therapy (Signed)
[place] [facility] MAIN [facility] SERVICES [address] [place], [place], [address] Phone: [phone]   Fax:  [phone]  Speech Language Pathology Treatment  Patient Details  Name: Damon Moore MRN: [id number] Date of Birth: October 18, 1970 Referring Provider (SLP): Linus Salmons   Encounter Date: 08/29/2020   End of Session - 08/30/20 0933    Visit Number 3    Number of Visits 17    Date for SLP Re-Evaluation 10/15/20    Authorization Type [employer]    Authorization Time Period 08/20/2020 thru 10/15/2020    Authorization - Visit Number 3    Progress Note Due on Visit 10    SLP Start Time 1400    SLP Stop Time  1445    SLP Time Calculation (min) 45 min    Activity Tolerance Patient tolerated treatment well           Past Medical History:  Diagnosis Date  . Coronary artery calcification   . Coronary artery calcification   . GERD (gastroesophageal reflux disease)   . HTN (hypertension)   . Mild dilation of ascending aorta (HCC)   . SVT (supraventricular tachycardia) (HCC)     No past surgical history on file.  There were no vitals filed for this visit.   Subjective Assessment - 08/30/20 0929    Subjective "something is blooming, my nose is congested"    Currently in Pain? No/denies                 ADULT SLP TREATMENT - 08/30/20 0001      Cognitive-Linquistic Treatment   Skilled Treatment Pt presents with increased hoarseness today. 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 While he appears to be speaking less words on one breath, his vocal abuse is severe d/t his career. Extensive time spent discussing amplification options with pt deciding to add a PA system to help with his classes. Education provided on how yelling is vocally abusive, inability to overcome vocal trauma with behavioral therapy in light of continued vocal abuse, and education provided on need to have PA on  high setting to allow him to reduce his volume.            SLP Education - 08/30/20 0933    Education Details VOCAL ABUSE    Person(s) Educated Patient    Methods Explanation;Demonstration    Comprehension Verbalized understanding;Need further instruction            SLP Short Term Goals - 08/22/20 0927      SLP SHORT TERM GOAL #1   Title Pt will complete HEP for breath support with minimal support.    Baseline new goal    Time 10    Period --   sessions   Status New      SLP SHORT TERM GOAL #2   Title Pt will use abdominal breathing > 90% accuracy in structure sentence level tasks.    Baseline new goal    Time 10    Period --   sessions   Status New            SLP Long Term Goals - 08/22/20 0930      SLP LONG TERM GOAL #1   Target Date 10/15/20      SLP LONG TERM GOAL #2   Title The patient will be independent for abdominal breathing and breath support exercises.    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 Pt reports increased water consumption and he has been attempting to speak fewer words on single breath. Despite these efforts, the severity of vocal abuse that pt uses in his current career is too great to overcome without him giving up instructing. Pt is agreeable to obtaining a PA system and plans to use in his next class. Recommend skilled ST to facilitate use of amplification system, train pt in vocal hygiene, improve breath support for voice and reduce laryngeal tension in order to improve vocal quality, endurance, and quality of life.    Speech Therapy Frequency 2x / week    Duration 8 weeks    Treatment/Interventions SLP instruction and feedback;Patient/family education    Potential to Achieve Goals Fair    Potential Considerations --    career   SLP Home Exercise Plan provided, see instruction section    Consulted and Agree with Plan of Care Patient           Patient will benefit from skilled therapeutic intervention in order to improve the following deficits and impairments:   Dysphonia    Problem List Patient Active Problem List   Diagnosis Date Noted  . Family history of early CAD 11/29/2018  . Gastroesophageal reflux disease 10/20/2017  . Eczema 10/20/2017  . AK (actinic keratosis) 10/20/2017  . History of PSVT (paroxysmal supraventricular tachycardia) 02/21/2015   Grey Schlauch B. Dreama Saa M.S., CCC-SLP, [facility] Speech-Language Pathologist Rehabilitation Services Office [phone]  Reuel Derby 08/30/2020, 9:37 AM   [facility] MAIN [facility] SERVICES [address] [place], [place], [address] Phone: [phone]   Fax:  [phone]   Name: Damon Moore MRN: [id number] Date of Birth: 11/18/1970

## 2020-08-30 NOTE — Patient Instructions (Signed)
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

## 2020-09-05 ENCOUNTER — Ambulatory Visit: Admitting: Speech Pathology

## 2020-09-08 ENCOUNTER — Other Ambulatory Visit: Payer: Self-pay | Admitting: Family

## 2020-09-08 DIAGNOSIS — I77810 Thoracic aortic ectasia: Secondary | ICD-10-CM

## 2020-09-08 DIAGNOSIS — E785 Hyperlipidemia, unspecified: Secondary | ICD-10-CM

## 2020-09-08 DIAGNOSIS — I10 Essential (primary) hypertension: Secondary | ICD-10-CM

## 2020-09-11 ENCOUNTER — Ambulatory Visit: Attending: Unknown Physician Specialty | Admitting: Speech Pathology

## 2020-09-11 ENCOUNTER — Other Ambulatory Visit: Payer: Self-pay

## 2020-09-11 DIAGNOSIS — R490 Dysphonia: Secondary | ICD-10-CM | POA: Diagnosis not present

## 2020-09-11 NOTE — Patient Instructions (Signed)
Vocal hygiene

## 2020-09-11 NOTE — Therapy (Signed)
[facility] MAIN [place] SERVICES [address] [place], [place], [address] Phone: [phone]   Fax:  [phone]  Speech Language Pathology Treatment DISCHARGE SUMMARY  Patient Details  Name: Damon Moore MRN: [id number] Date of Birth: 20-Dec-1970 Referring Provider (SLP): Linus Salmons   Encounter Date: 09/11/2020   End of Session - 09/11/20 1441    Visit Number 4    Number of Visits 17    Date for SLP Re-Evaluation 10/15/20    Authorization Type [employer]    Authorization Time Period 08/20/2020 thru 10/15/2020    Authorization - Visit Number 4    Progress Note Due on Visit 10    SLP Start Time 1400    SLP Stop Time  1430    SLP Time Calculation (min) 30 min    Activity Tolerance Patient tolerated treatment well           Past Medical History:  Diagnosis Date  . Coronary artery calcification   . Coronary artery calcification   . GERD (gastroesophageal reflux disease)   . HTN (hypertension)   . Mild dilation of ascending aorta (HCC)   . SVT (supraventricular tachycardia) (HCC)     No past surgical history on file.  There were no vitals filed for this visit.   Subjective Assessment - 09/11/20 1440    Subjective "my voice is back at baseline"    Currently in Pain? No/denies                 ADULT SLP TREATMENT - 09/11/20 0001      Cognitive-Linquistic Treatment   Skilled Treatment Session focused on completing vocal hygiene education. Pt able to demonstrate knowledge by return demonstration.            SLP Education - 09/11/20 1441    Education Details vocal abuse    Person(s) Educated Patient    Methods Explanation;Demonstration;Verbal cues    Comprehension Returned demonstration;Verbalized understanding            SLP Short Term Goals - 09/11/20 1442      SLP SHORT TERM GOAL #1   Title Pt will complete HEP for breath support with minimal support.    Status Achieved      SLP SHORT TERM GOAL #2    Title Pt will use abdominal breathing > 90% accuracy in structure sentence level tasks.    Status Achieved            SLP Long Term Goals - 09/11/20 1442      SLP LONG TERM GOAL #1   Title Pt will report carryover of 4 vocal hygiene techniques between 3 consecutive sessions.    Status Achieved      SLP LONG TERM GOAL #2   Title The patient will be independent for abdominal breathing and breath support exercises.    Status Achieved            Plan - 09/11/20 1442    Clinical Impression Statement Pt states that his voice at "back to baseline." While is vocal quality is not ideal, he is no longer in pain when speaking and the quality is improved over initial evaluation and session. Pt demonstrates understanding of vocal hygiene. 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 He is also consuming more water and resting his voice better. At this time, pt is implementing healthy compensatory strategies to decrease phonotraumatic behaviors.    SLP Home  Exercise Plan provided, see instruction section    Consulted and Agree with Plan of Care Patient            Problem List Patient Active Problem List   Diagnosis Date Noted  . Family history of early CAD 11/29/2018  . Gastroesophageal reflux disease 10/20/2017  . Eczema 10/20/2017  . AK (actinic keratosis) 10/20/2017  . History of PSVT (paroxysmal supraventricular tachycardia) 02/21/2015   Gabriana Wilmott B. Dreama Saa M.S., CCC-SLP, [facility] Speech-Language Pathologist Rehabilitation Services Office [phone]  Reuel Derby 09/11/2020, 2:43 PM  [place] [facility] MAIN [facility] SERVICES [address] [place], [place], [address] Phone: [phone]   Fax:  [phone]   Name: Damon Moore MRN: [id number] Date of Birth: Jun 12, 1970

## 2020-09-13 ENCOUNTER — Ambulatory Visit: Admitting: Speech Pathology

## 2020-09-17 ENCOUNTER — Ambulatory Visit: Admitting: Speech Pathology

## 2020-09-19 ENCOUNTER — Ambulatory Visit: Admitting: Speech Pathology

## 2020-09-25 ENCOUNTER — Ambulatory Visit: Admitting: Speech Pathology

## 2020-09-26 ENCOUNTER — Ambulatory Visit: Admitting: Speech Pathology

## 2020-10-02 ENCOUNTER — Ambulatory Visit: Admitting: Speech Pathology

## 2020-10-03 ENCOUNTER — Ambulatory Visit: Admitting: Speech Pathology

## 2020-10-10 ENCOUNTER — Ambulatory Visit: Admitting: Speech Pathology

## 2020-10-15 ENCOUNTER — Ambulatory Visit: Admitting: Speech Pathology

## 2020-10-17 ENCOUNTER — Ambulatory Visit: Admitting: Speech Pathology

## 2021-02-03 ENCOUNTER — Other Ambulatory Visit: Payer: Self-pay | Admitting: Family

## 2021-02-03 DIAGNOSIS — E785 Hyperlipidemia, unspecified: Secondary | ICD-10-CM

## 2021-02-03 DIAGNOSIS — I77810 Thoracic aortic ectasia: Secondary | ICD-10-CM

## 2021-02-03 DIAGNOSIS — I10 Essential (primary) hypertension: Secondary | ICD-10-CM

## 2021-02-04 NOTE — Telephone Encounter (Signed)
Rx(s) sent to pharmacy electronically.  

## 2021-05-06 ENCOUNTER — Other Ambulatory Visit: Payer: Self-pay | Admitting: Family

## 2021-05-06 DIAGNOSIS — I10 Essential (primary) hypertension: Secondary | ICD-10-CM

## 2021-05-06 DIAGNOSIS — I77810 Thoracic aortic ectasia: Secondary | ICD-10-CM

## 2021-05-06 DIAGNOSIS — E785 Hyperlipidemia, unspecified: Secondary | ICD-10-CM

## 2021-05-19 NOTE — Progress Notes (Signed)
Cardiology Office Note  Date:  05/20/2021   ID:  Damon Moore, DOB November 25, 1970, MRN [id number]  PCP:  Smitty Cords, DO   Chief Complaint  Patient presents with   Follow-up    "Doing well." Medications reviewed by the patient verbally.     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  Hypertension Prior smoking history SVT  PSH:   No past surgical history on file.  Current Outpatient Medications  Medication Sig Dispense Refill   aspirin EC 81 MG tablet Take 1 tablet (81 mg total) by mouth daily. Swallow whole. 90 tablet 3   losartan (COZAAR) 100 MG tablet TAKE 1 TABLET DAILY 90 tablet 0   pantoprazole (PROTONIX) 40 MG tablet Take 1 tablet (40 mg total) by mouth 2 (two) times daily before a meal. 180 tablet 3   rosuvastatin (CRESTOR) 5 MG tablet TAKE 1 TABLET DAILY 90 tablet 0   amoxicillin-clavulanate (AUGMENTIN) 875-125 MG tablet Take 1 tablet by mouth 2 (two) times daily. For 10 days (Patient not taking:  Reported on 08/22/2020) 20 tablet 0   No current facility-administered medications for this visit.     Allergies:   Patient has no known allergies.   Social History:  The patient  reports that he quit smoking about 12 years ago. His smoking use included cigarettes. He has quit using smokeless tobacco. He reports that he does not currently use alcohol. He reports that he does not use drugs.   Family History:   family history includes Hearing loss in his paternal grandfather and paternal grandmother; Heart disease in his father.    Review of Systems: Review of Systems  Constitutional: Negative.   HENT: Negative.    Respiratory: Negative.    Cardiovascular: Negative.   Gastrointestinal: Negative.   Musculoskeletal: Negative.   Neurological: Negative.   Psychiatric/Behavioral: Negative.    All other systems reviewed and are negative.   PHYSICAL EXAM: VS:  BP 130/84 (BP Location: Left Arm, Patient Position: Sitting, Cuff Size: Normal)    Pulse 76    Ht 5\' 8"  (1.727 m)    Wt 225 lb (102.1 kg)    SpO2 98%    BMI 34.21 kg/m  , BMI Body mass index is 34.21 kg/m. Constitutional:  oriented to person, place, and time. No distress.  HENT:  Head: Grossly normal Eyes:  no discharge. No scleral icterus.  Neck: No JVD, no carotid bruits  Cardiovascular: Regular rate and rhythm, no murmurs appreciated Pulmonary/Chest: Clear to auscultation bilaterally, no  wheezes or rails Abdominal: Soft.  no distension.  no tenderness.  Musculoskeletal: Normal range of motion Neurological:  normal muscle tone. Coordination normal. No atrophy Skin: Skin warm and dry Psychiatric: normal affect, pleasant  Recent Labs: No results found for requested labs within last 8760 hours.    Lipid Panel Lab Results  Component Value Date   CHOL 243 (H) 11/30/2018   HDL 57 11/30/2018   LDLCALC 167 (H) 11/30/2018   TRIG 96 11/30/2018      Wt Readings from Last 3 Encounters:  05/20/21 225 lb (102.1 kg)   04/18/20 220 lb (99.8 kg)  03/09/20 220 lb (99.8 kg)     ASSESSMENT AND PLAN:  Problem List Items Addressed This Visit   None Visit Diagnoses     SVT (supraventricular tachycardia) (HCC)    -  Primary   Primary hypertension       Hyperlipidemia LDL goal <70       Coronary artery calcification seen on CT scan       Mild dilation of ascending aorta (HCC)          Hyperlipidemia Continue Crestor 5, lipid panel ordered Low calcium scoring We will send in refills of Crestor once lipids are available to determine if higher dose needed  SVT No recent episodes Avoid stimulants such as Sudafed  Hypertension Continue losartan   Total encounter time more than 30 minutes  Greater than 50% was spent in counseling and coordination of care with the patient    Signed, Dossie Arbour, M.D., Ph.D. [facility] [place], [place] [phone]

## 2021-05-20 ENCOUNTER — Encounter: Payer: Self-pay | Admitting: Cardiovascular Disease

## 2021-05-20 ENCOUNTER — Ambulatory Visit (INDEPENDENT_AMBULATORY_CARE_PROVIDER_SITE_OTHER): Admitting: Cardiovascular Disease

## 2021-05-20 ENCOUNTER — Other Ambulatory Visit
Admission: RE | Admit: 2021-05-20 | Discharge: 2021-05-20 | Disposition: A | Discharge status: Home / Self Care | Source: Ambulatory Visit | Attending: Cardiovascular Disease | Admitting: Cardiovascular Disease

## 2021-05-20 ENCOUNTER — Other Ambulatory Visit: Payer: Self-pay

## 2021-05-20 VITALS — BP 130/84 | HR 76 | Ht 68.0 in | Wt 225.0 lb

## 2021-05-20 DIAGNOSIS — I10 Essential (primary) hypertension: Secondary | ICD-10-CM | POA: Diagnosis not present

## 2021-05-20 DIAGNOSIS — Z79899 Other long term (current) drug therapy: Secondary | ICD-10-CM | POA: Diagnosis present

## 2021-05-20 DIAGNOSIS — I2510 Atherosclerotic heart disease of native coronary artery without angina pectoris: Secondary | ICD-10-CM | POA: Diagnosis not present

## 2021-05-20 DIAGNOSIS — I77810 Thoracic aortic ectasia: Secondary | ICD-10-CM

## 2021-05-20 DIAGNOSIS — E785 Hyperlipidemia, unspecified: Secondary | ICD-10-CM | POA: Diagnosis not present

## 2021-05-20 DIAGNOSIS — I471 Supraventricular tachycardia: Secondary | ICD-10-CM

## 2021-05-20 LAB — HEMOGLOBIN A1C
Hgb A1c MFr Bld: 5.6 % (ref 4.8–5.6)
Mean Plasma Glucose: 114.02 mg/dL

## 2021-05-20 LAB — COMPREHENSIVE METABOLIC PANEL
ALT: 17 U/L (ref 0–44)
AST: 19 U/L (ref 15–41)
Albumin: 4.2 g/dL (ref 3.5–5.0)
Alkaline Phosphatase: 56 U/L (ref 38–126)
Anion gap: 5 (ref 5–15)
BUN: 21 mg/dL — ABNORMAL HIGH (ref 6–20)
CO2: 25 mmol/L (ref 22–32)
Calcium: 9.2 mg/dL (ref 8.9–10.3)
Chloride: 104 mmol/L (ref 98–111)
Creatinine, Ser: 0.79 mg/dL (ref 0.61–1.24)
GFR, Estimated: >60 mL/min (ref >60)
Glucose, Bld: 98 mg/dL (ref 70–99)
Potassium: 4.1 mmol/L (ref 3.5–5.1)
Sodium: 134 mmol/L — ABNORMAL LOW (ref 135–145)
Total Bilirubin: 0.9 mg/dL (ref 0.3–1.2)
Total Protein: 7.8 g/dL (ref 6.5–8.1)

## 2021-05-20 LAB — LIPID PANEL
Cholesterol: 218 mg/dL — ABNORMAL HIGH (ref 0–200)
HDL: 61 mg/dL (ref >40)
LDL Cholesterol: 130 mg/dL — ABNORMAL HIGH (ref 0–99)
Total CHOL/HDL Ratio: 3.6 RATIO
Triglycerides: 135 mg/dL (ref <150)
VLDL: 27 mg/dL (ref 0–40)

## 2021-05-20 MED ORDER — LOSARTAN POTASSIUM 100 MG PO TABS: 100.0000 mg | ORAL_TABLET | Freq: Every day | ORAL | 3 refills | Status: DC

## 2021-05-20 NOTE — Patient Instructions (Addendum)
Medication Instructions:  No changes  If you need a refill on your cardiac medications before your next appointment, please call your pharmacy.   Lab work: Lipids, CMP, A1c Please go to medical mall for labs  Testing/Procedures: No new testing needed  Follow-Up: At [facility], you and your health needs are our priority.  As part of our continuing mission to provide you with exceptional heart care, we have created designated Provider Care Teams.  These Care Teams include your primary Cardiologist (physician) and Advanced Practice Providers (APPs -  Physician Assistants and Nurse Practitioners) who all work together to provide you with the care you need, when you need it.  You will need a follow up appointment in 12 months  Providers on your designated Care Team:   Nicolasa Ducking, NP Eula Listen, PA-C Cadence Fransico Michael, [place]  COVID-19 Vaccine Information can be found at: [email or web address] For questions related to vaccine distribution or appointments, please email [email or web address] or call [phone].

## 2021-05-27 ENCOUNTER — Telehealth: Payer: Self-pay

## 2021-05-27 NOTE — Telephone Encounter (Signed)
Attempted to reach out to pt Unable to make contact by phone No DPR on file to LM Will attempt at later time to call back

## 2021-05-28 ENCOUNTER — Telehealth: Payer: Self-pay

## 2021-05-28 DIAGNOSIS — E785 Hyperlipidemia, unspecified: Secondary | ICD-10-CM

## 2021-05-28 MED ORDER — ROSUVASTATIN CALCIUM 20 MG PO TABS: 20.0000 mg | ORAL_TABLET | Freq: Every day | ORAL | 3 refills | Status: DC

## 2021-05-28 NOTE — Telephone Encounter (Signed)
Able to reach pt regarding his recent lab work, Dr. Mariah Milling had a chance to review their results and advised   "Overall lab work good except for cholesterol running high around 220 with LDL 130  Would like it a little bit lower, would consider increasing Crestor up to 20 daily  For any side effects would let me know "  [name] agrees to increase his Crestor to 20 mg daily, new script sent in to [facility], otherwise all questions or concerns were address and no additional concerns at this time, will call back for anything further.

## 2022-05-28 ENCOUNTER — Other Ambulatory Visit: Payer: Self-pay | Admitting: Cardiovascular Disease

## 2022-05-28 DIAGNOSIS — I77810 Thoracic aortic ectasia: Secondary | ICD-10-CM

## 2022-05-28 DIAGNOSIS — I10 Essential (primary) hypertension: Secondary | ICD-10-CM

## 2023-03-02 ENCOUNTER — Telehealth: Admitting: Physician Assistant

## 2023-03-02 DIAGNOSIS — J069 Acute upper respiratory infection, unspecified: Secondary | ICD-10-CM

## 2023-03-02 DIAGNOSIS — B9689 Other specified bacterial agents as the cause of diseases classified elsewhere: Secondary | ICD-10-CM | POA: Diagnosis not present

## 2023-03-02 MED ORDER — AMOXICILLIN-POT CLAVULANATE 875-125 MG PO TABS: 1.0000 | ORAL_TABLET | Freq: Two times a day (BID) | ORAL | 0 refills | Status: DC

## 2023-03-02 NOTE — Progress Notes (Signed)
Virtual Visit Consent   Damon Moore, you are scheduled for a virtual visit with a [facility] provider today. Just as with appointments in the office, your consent must be obtained to participate. Your consent will be active for this visit and any virtual visit you may have with one of our providers in the next 365 days. If you have a MyChart account, a copy of this consent can be sent to you electronically.  As this is a virtual visit, video technology does not allow for your provider to perform a traditional examination. This may limit your provider's ability to fully assess your condition. If your provider identifies any concerns that need to be evaluated in person or the need to arrange testing (such as labs, EKG, etc.), we will make arrangements to do so. Although advances in technology are sophisticated, we cannot ensure that it will always work on either your end or our end. If the connection with a video visit is poor, the visit may have to be switched to a telephone visit. With either a video or telephone visit, we are not always able to ensure that we have a secure connection.  By engaging in this virtual visit, you consent to the provision of healthcare and authorize for your insurance to be billed (if applicable) for the services provided during this visit. Depending on your insurance coverage, you may receive a charge related to this service.  I need to obtain your verbal consent now. Are you willing to proceed with your visit today? Damon Moore has provided verbal consent on 03/02/2023 for a virtual visit (video or telephone). [name], PA-C  Date: 03/02/2023 3:31 PM  Virtual Visit via Video Note   I, [name], connected with  Damon Moore  ([id number], [birth year]) on 03/02/23 at  3:30 PM EST by a video-enabled telemedicine application and verified that I am speaking with the correct person using two identifiers.  Location: Patient: Virtual Visit Location  Patient: Mobile Provider: Virtual Visit Location Provider: Home Office   I discussed the limitations of evaluation and management by telemedicine and the availability of in person appointments. The patient expressed understanding and agreed to proceed.    History of Present Illness: Damon Moore is a 52 y.o. who identifies as a male who was assigned male at birth, and is being seen today for URI symptoms.  HPI: URI  This is a new problem. The current episode started in the past 7 days. The problem has been gradually worsening. Maximum temperature: subjective fevers last night. The fever has been present for Less than 1 day. Associated symptoms include congestion, coughing, headaches, rhinorrhea, sinus pain and a sore throat. Pertinent negatives include no diarrhea, ear pain, nausea, plugged ear sensation, vomiting or wheezing. Treatments tried: tylenol, sudafed. The treatment provided no relief.    Problems:  Patient Active Problem List   Diagnosis Date Noted   Family history of early CAD 11/29/2018   Gastroesophageal reflux disease 10/20/2017   Eczema 10/20/2017   AK (actinic keratosis) 10/20/2017   History of PSVT (paroxysmal supraventricular tachycardia) 02/21/2015    Allergies: No Known Allergies Medications:  Current Outpatient Medications:    amoxicillin-clavulanate (AUGMENTIN) 875-125 MG tablet, Take 1 tablet by mouth 2 (two) times daily., Disp: 20 tablet, Rfl: 0   aspirin EC 81 MG tablet, Take 1 tablet (81 mg total) by mouth daily. Swallow whole., Disp: 90 tablet, Rfl: 3   losartan (COZAAR) 100 MG tablet, TAKE 1 TABLET DAILY, Disp: 90 tablet,  Rfl: 3   pantoprazole (PROTONIX) 40 MG tablet, Take 1 tablet (40 mg total) by mouth 2 (two) times daily before a meal., Disp: 180 tablet, Rfl: 3   rosuvastatin (CRESTOR) 20 MG tablet, Take 1 tablet (20 mg total) by mouth daily., Disp: 90 tablet, Rfl: 3  Observations/Objective: Patient is well-developed, well-nourished in no acute distress.   Resting comfortably   Head is normocephalic, atraumatic.  No labored breathing.  Speech is clear and coherent with logical content.  Patient is alert and oriented at baseline.    Assessment and Plan: 1. Bacterial upper respiratory infection - amoxicillin-clavulanate (AUGMENTIN) 875-125 MG tablet; Take 1 tablet by mouth 2 (two) times daily.  Dispense: 20 tablet; Refill: 0  - Worsening symptoms that have not responded to OTC medications.  - Will give Augmentin - Continue allergy medications.  - Steam and humidifier can help - Stay well hydrated and get plenty of rest.  - Seek in person evaluation if no symptom improvement or if symptoms worsen   Follow Up Instructions: I discussed the assessment and treatment plan with the patient. The patient was provided an opportunity to ask questions and all were answered. The patient agreed with the plan and demonstrated an understanding of the instructions.  A copy of instructions were sent to the patient via MyChart unless otherwise noted below.    The patient was advised to call back or seek an in-person evaluation if the symptoms worsen or if the condition fails to improve as anticipated.    [name], PA-C

## 2023-03-02 NOTE — Patient Instructions (Signed)
Damon Moore, thank you for joining [name], PA-C for today's virtual visit.  While this provider is not your primary care provider (PCP), if your PCP is located in our provider database this encounter information will be shared with them immediately following your visit.   A Damon Moore account gives you access to today's visit and all your visits, tests, and labs performed at [facility] " click here if you don't have a Damon Moore account or go to [email or web address]  Consent: (Patient) Damon Moore provided verbal consent for this virtual visit at the beginning of the encounter.  Current Medications:  Current Outpatient Medications:    amoxicillin-clavulanate (AUGMENTIN) 875-125 MG tablet, Take 1 tablet by mouth 2 (two) times daily., Disp: 20 tablet, Rfl: 0   aspirin EC 81 MG tablet, Take 1 tablet (81 mg total) by mouth daily. Swallow whole., Disp: 90 tablet, Rfl: 3   losartan (COZAAR) 100 MG tablet, TAKE 1 TABLET DAILY, Disp: 90 tablet, Rfl: 3   pantoprazole (PROTONIX) 40 MG tablet, Take 1 tablet (40 mg total) by mouth 2 (two) times daily before a meal., Disp: 180 tablet, Rfl: 3   rosuvastatin (CRESTOR) 20 MG tablet, Take 1 tablet (20 mg total) by mouth daily., Disp: 90 tablet, Rfl: 3   Medications ordered in this encounter:  Meds ordered this encounter  Medications   amoxicillin-clavulanate (AUGMENTIN) 875-125 MG tablet    Sig: Take 1 tablet by mouth 2 (two) times daily.    Dispense:  20 tablet    Refill:  0    Order Specific Question:   Supervising Provider    Answer:   Damon Moore [id number]     *If you need refills on other medications prior to your next appointment, please contact your pharmacy*  Follow-Up: Call back or seek an in-person evaluation if the symptoms worsen or if the condition fails to improve as anticipated.  [facility] [phone]  Other Instructions Upper Respiratory Infection,  Adult An upper respiratory infection (URI) is a common viral infection of the nose, throat, and upper air passages that lead to the lungs. The most common type of URI is the common cold. URIs usually get better on their own, without medical treatment. What are the causes? A URI is caused by a virus. You may catch a virus by: Breathing in droplets from an infected person's cough or sneeze. Touching something that has been exposed to the virus (is contaminated) and then touching your mouth, nose, or eyes. What increases the risk? You are more likely to get a URI if: You are very young or very old. You have close contact with others, such as at work, school, or a health care facility. You smoke. You have long-term (chronic) heart or lung disease. You have a weakened disease-fighting system (immune system). You have nasal allergies or asthma. You are experiencing a lot of stress. You have poor nutrition. What are the signs or symptoms? A URI usually involves some of the following symptoms: Runny or stuffy (congested) nose. Cough. Sneezing. Sore throat. Headache. Fatigue. Fever. Loss of appetite. Pain in your forehead, behind your eyes, and over your cheekbones (sinus pain). Muscle aches. Redness or irritation of the eyes. Pressure in the ears or face. How is this diagnosed? This condition may be diagnosed based on your medical history and symptoms, and a physical exam. Your health care provider may use a swab to take a mucus sample from your nose (nasal swab). This  sample can be tested to determine what virus is causing the illness. How is this treated? URIs usually get better on their own within 7-10 days. Medicines cannot cure URIs, but your health care provider may recommend certain medicines to help relieve symptoms, such as: Over-the-counter cold medicines. Cough suppressants. Coughing is a type of defense against infection that helps to clear the respiratory system, so take these  medicines only as recommended by your health care provider. Fever-reducing medicines. Follow these instructions at home: Activity Rest as needed. If you have a fever, stay home from work or school until your fever is gone or until your health care provider says your URI cannot spread to other people (is no longer contagious). Your health care provider may have you wear a face mask to prevent your infection from spreading. Relieving symptoms Gargle with a mixture of salt and water 3-4 times a day or as needed. To make salt water, completely dissolve -1 tsp (3-6 g) of salt in 1 cup (237 mL) of warm water. Use a cool-mist humidifier to add moisture to the air. This can help you breathe more easily. Eating and drinking  Drink enough fluid to keep your urine pale yellow. Eat soups and other clear broths. General instructions  Take over-the-counter and prescription medicines only as told by your health care provider. These include cold medicines, fever reducers, and cough suppressants. Do not use any products that contain nicotine or tobacco. These products include cigarettes, chewing tobacco, and vaping devices, such as e-cigarettes. If you need help quitting, ask your health care provider. Stay away from secondhand smoke. Stay up to date on all immunizations, including the yearly (annual) flu vaccine. Keep all follow-up visits. This is important. How to prevent the spread of infection to others URIs can be contagious. To prevent the infection from spreading: Wash your hands with soap and water for at least 20 seconds. If soap and water are not available, use hand sanitizer. Avoid touching your mouth, face, eyes, or nose. Cough or sneeze into a tissue or your sleeve or elbow instead of into your hand or into the air.  Contact a health care provider if: You are getting worse instead of better. You have a fever or chills. Your mucus is brown or red. You have yellow or brown discharge coming  from your nose. You have pain in your face, especially when you bend forward. You have swollen neck glands. You have pain while swallowing. You have white areas in the back of your throat. Get help right away if: You have shortness of breath that gets worse. You have severe or persistent: Headache. Ear pain. Sinus pain. Chest pain. You have chronic lung disease along with any of the following: Making high-pitched whistling sounds when you breathe, most often when you breathe out (wheezing). Prolonged cough (more than 14 days). Coughing up blood. A change in your usual mucus. You have a stiff neck. You have changes in your: Vision. Hearing. Thinking. Mood. These symptoms may be an emergency. Get help right away. Call 911. Do not wait to see if the symptoms will go away. Do not drive yourself to the hospital. Summary An upper respiratory infection (URI) is a common infection of the nose, throat, and upper air passages that lead to the lungs. A URI is caused by a virus. URIs usually get better on their own within 7-10 days. Medicines cannot cure URIs, but your health care provider may recommend certain medicines to help relieve symptoms. This information  is not intended to replace advice given to you by your health care provider. Make sure you discuss any questions you have with your health care provider. Document Revised: 10/24/2020 Document Reviewed: 10/24/2020 Elsevier Patient Education  2024 Elsevier Inc.    If you have been instructed to have an in-person evaluation today at a local Urgent Care facility, please use the link below. It will take you to a list of all of our available [facility], including address, phone number and hours of operation. Please do not delay care.  [facility]  If you or a family member do not have a primary care provider, use the link below to schedule a visit and establish care. When you choose a [facility] primary  care physician or advanced practice provider, you gain a long-term partner in health. Find a Primary Care Provider  Learn more about [facility]'s in-office and virtual care options:  - Get Care Now

## 2023-04-16 NOTE — Progress Notes (Signed)
 Ready for swimming cardiology Office Note  Date:  04/17/2023   ID:  Damon Moore, DOB 01-Nov-1970, MRN [id number]  PCP:  Edman Marsa PARAS, DO   Chief Complaint  Patient presents with   12 month follow up     Doing well.  Patient had stopped the Losartan  & Rosuvastatin  x 4 months but has recently been back on x 1 month.     HPI:  Mr Damon Moore is a 53 yo male with PMH of  GERD Obesity Low back pain Smoker quit 2012,  Age 70 to 72 Very low calcium  score, <1 Remote history SVT on Sudafed, 2008 Mild dilation of ascending aorta 3.9 cm on CT scan October 2020 Presents for f/u of his SVT  In follow-up today, he reports he is doing well Works as emergency planning/management officer, training others Active at baseline Denies chest pain or shortness of breath on exertion Would like to lose weight  Reports that he was off his losartan  and Crestor  for several months Restarted medications recently in the past month Not checking blood pressure at home  EKG personally reviewed by myself on todays visit EKG Interpretation Date/Time:  Friday April 17 2023 09:14:23 EST Ventricular Rate:  60 PR Interval:  168 QRS Duration:  118 QT Interval:  446 QTC Calculation: 446 R Axis:   -18  Text Interpretation: Normal sinus rhythm No previous ECGs available Confirmed by Perla Lye [id number]) on 04/17/2023 9:25:03 AM    Very strong family history of coronary disease Father died 17 MI Uncle 56 died   PMH:   has a past medical history of Coronary artery calcification, Coronary artery calcification, GERD (gastroesophageal reflux disease), HTN (hypertension), Mild dilation of ascending aorta (HCC), and SVT (supraventricular tachycardia) (HCC).  Hypertension Prior smoking history SVT  PSH:   History reviewed. No pertinent surgical history.  Current Outpatient Medications  Medication Sig Dispense Refill   aspirin  EC 81 MG tablet Take 1 tablet (81 mg total) by mouth daily. Swallow whole. 90 tablet 3    losartan  (COZAAR ) 100 MG tablet TAKE 1 TABLET DAILY 90 tablet 3   pantoprazole  (PROTONIX ) 40 MG tablet Take 1 tablet (40 mg total) by mouth 2 (two) times daily before a meal. 180 tablet 3   rosuvastatin  (CRESTOR ) 20 MG tablet Take 1 tablet (20 mg total) by mouth daily. 90 tablet 3   amoxicillin -clavulanate (AUGMENTIN ) 875-125 MG tablet Take 1 tablet by mouth 2 (two) times daily. (Patient not taking: Reported on 04/17/2023) 20 tablet 0   No current facility-administered medications for this visit.     Allergies:   Patient has no known allergies.   Social History:  The patient  reports that he quit smoking about 14 years ago. His smoking use included cigarettes. He has quit using smokeless tobacco. He reports that he does not currently use alcohol. He reports that he does not use drugs.   Family History:   family history includes Hearing loss in his paternal grandfather and paternal grandmother; Heart disease in his father.    Review of Systems: Review of Systems  Constitutional: Negative.   HENT: Negative.    Respiratory: Negative.    Cardiovascular: Negative.   Gastrointestinal: Negative.   Musculoskeletal: Negative.   Neurological: Negative.   Psychiatric/Behavioral: Negative.    All other systems reviewed and are negative.    PHYSICAL EXAM: VS:  BP (!) 150/100 (BP Location: Left Arm, Patient Position: Sitting, Cuff Size: Normal)   Pulse 60   Ht 5' 8 (1.727  m)   Wt 225 lb (102.1 kg)   SpO2 97%   BMI 34.21 kg/m  , BMI Body mass index is 34.21 kg/m. Constitutional:  oriented to person, place, and time. No distress.  HENT:  Head: Grossly normal Eyes:  no discharge. No scleral icterus.  Neck: No JVD, no carotid bruits  Cardiovascular: Regular rate and rhythm, no murmurs appreciated Pulmonary/Chest: Clear to auscultation bilaterally, no wheezes or rails Abdominal: Soft.  no distension.  no tenderness.  Musculoskeletal: Normal range of motion Neurological:  normal muscle  tone. Coordination normal. No atrophy Skin: Skin warm and dry Psychiatric: normal affect, pleasant  Recent Labs: No results found for requested labs within last 365 days.    Lipid Panel Lab Results  Component Value Date   CHOL 218 (H) 05/20/2021   HDL 61 05/20/2021   LDLCALC 130 (H) 05/20/2021   TRIG 135 05/20/2021      Wt Readings from Last 3 Encounters:  04/17/23 225 lb (102.1 kg)  05/20/21 225 lb (102.1 kg)  04/18/20 220 lb (99.8 kg)     ASSESSMENT AND PLAN:  Problem List Items Addressed This Visit   None Visit Diagnoses       SVT (supraventricular tachycardia) (HCC)    -  Primary   Relevant Orders   EKG 12-Lead (Completed)     Hyperlipidemia LDL goal <70       Relevant Orders   EKG 12-Lead (Completed)     Primary hypertension         Coronary artery calcification seen on CT scan       Relevant Orders   EKG 12-Lead (Completed)       Hyperlipidemia Reports that he had stopped his Crestor  for several months, only restarted Continue Crestor  5, Low calcium  scoring Crestor  refill sent in  SVT No recent episodes Avoid stimulants such as Sudafed Beta-blockers if needed  Hypertension Reports that he was off his medication for several months Suggest he continue losartan  Repeat blood pressure much improved on today's visit Recommend monitoring blood pressure at home   Signed, Velinda Lunger, M.D., Ph.D. [facility] [place], [place] [phone]

## 2023-04-17 ENCOUNTER — Encounter: Payer: Self-pay | Admitting: Cardiovascular Disease

## 2023-04-17 ENCOUNTER — Ambulatory Visit: Attending: Cardiovascular Disease | Admitting: Cardiovascular Disease

## 2023-04-17 VITALS — BP 128/90 | HR 60 | Ht 68.0 in | Wt 225.0 lb

## 2023-04-17 DIAGNOSIS — E785 Hyperlipidemia, unspecified: Secondary | ICD-10-CM

## 2023-04-17 DIAGNOSIS — I471 Supraventricular tachycardia, unspecified: Secondary | ICD-10-CM

## 2023-04-17 DIAGNOSIS — I10 Essential (primary) hypertension: Secondary | ICD-10-CM

## 2023-04-17 DIAGNOSIS — I2510 Atherosclerotic heart disease of native coronary artery without angina pectoris: Secondary | ICD-10-CM

## 2023-04-17 DIAGNOSIS — I77810 Thoracic aortic ectasia: Secondary | ICD-10-CM

## 2023-04-17 MED ORDER — ROSUVASTATIN CALCIUM 20 MG PO TABS: 20.0000 mg | ORAL_TABLET | Freq: Every day | ORAL | 4 refills | Status: AC

## 2023-04-17 MED ORDER — LOSARTAN POTASSIUM 100 MG PO TABS: 100.0000 mg | ORAL_TABLET | Freq: Every day | ORAL | 4 refills | Status: AC

## 2023-04-17 NOTE — Patient Instructions (Signed)

## 2023-08-14 ENCOUNTER — Encounter: Payer: Self-pay | Admitting: Urology

## 2023-08-14 ENCOUNTER — Ambulatory Visit (INDEPENDENT_AMBULATORY_CARE_PROVIDER_SITE_OTHER): Admitting: Urology

## 2023-08-14 VITALS — BP 146/76 | HR 63 | Ht 68.0 in | Wt 225.0 lb

## 2023-08-14 DIAGNOSIS — Z3009 Encounter for other general counseling and advice on contraception: Secondary | ICD-10-CM

## 2023-08-14 NOTE — Patient Instructions (Signed)

## 2023-08-14 NOTE — Progress Notes (Signed)
 08/14/2023 8:40 AM   Damon Moore 1971/02/19 [id number]  Referring provider: Raina Bunting, DO [address] [place],  [place] [address]  Chief Complaint  Patient presents with   VAS Consult    HPI: Damon Moore is a 53 y.o. male who presents for vasectomy counseling.  Married with 2 children Denies prior history urologic problems including chronic scrotal pain, epididymitis or orchitis No previous history inguinal hernia or pelvic surgery No history of bleeding or clotting disorders   PMH: Past Medical History:  Diagnosis Date   Coronary artery calcification    Coronary artery calcification    GERD (gastroesophageal reflux disease)    HTN (hypertension)    Mild dilation of ascending aorta (HCC)    SVT (supraventricular tachycardia) (HCC)     Surgical History: No past surgical history on file.  Home Medications:  Allergies as of 08/14/2023   No Known Allergies      Medication List        Accurate as of Aug 14, 2023  8:40 AM. If you have any questions, ask your nurse or doctor.          STOP taking these medications    amoxicillin -clavulanate 875-125 MG tablet Commonly known as: AUGMENTIN  Stopped by: Geraline Knapp       TAKE these medications    aspirin  EC 81 MG tablet Take 1 tablet (81 mg total) by mouth daily. Swallow whole.   losartan  100 MG tablet Commonly known as: COZAAR  Take 1 tablet (100 mg total) by mouth daily.   pantoprazole  40 MG tablet Commonly known as: PROTONIX  Take 1 tablet (40 mg total) by mouth 2 (two) times daily before a meal.   rosuvastatin  20 MG tablet Commonly known as: CRESTOR  Take 1 tablet (20 mg total) by mouth daily.        Allergies: No Known Allergies  Family History: Family History  Problem Relation Age of Onset   Heart disease Father    Hearing loss Paternal Grandmother    Hearing loss Paternal Grandfather     Social History:  reports that he quit smoking about 14 years ago. His smoking use  included cigarettes. He has quit using smokeless tobacco. He reports that he does not currently use alcohol. He reports that he does not use drugs.   Physical Exam: BP (!) 146/76   Pulse 63   Ht 5\' 8"  (1.727 m)   Wt 225 lb (102.1 kg)   BMI 34.21 kg/m   Constitutional:  Alert and oriented, No acute distress. HEENT: [place] AT Respiratory: Normal respiratory effort, no increased work of breathing. GU: Phallus without lesions, testes descended bilaterally without masses or tenderness, spermatic cord/epididymis palpably normal bilaterally.  Vasa palpable bilaterally Psychiatric: Normal mood and affect.   Assessment & Plan:    1.  Undesired fertility Desires to schedule vasectomy We had a long discussion about vasectomy. We specifically discussed the procedure, recovery and the risks, benefits and alternatives of vasectomy. I explained that the procedure entails removal of a segment of each vas deferens, each of which conducts sperm, and that the purpose of this procedure is to cause sterility (inability to produce children or cause pregnancy). Vasectomy is intended to be permanent and irreversible form of contraception. We discussed the importance of avoiding strenuous exercise for four days after vasectomy, and the importance of refraining from any form of ejaculation for seven days after vasectomy. I explained that vasectomy does not produce immediate sterility so another form of contraceptive  must be used until sterility is assured by having semen checked for sperm. Thus, a post vasectomy semen analysis is necessary to confirm sterility. Rarely, vasectomy must be repeated. We discussed the approximately 1 in 2,000 risk of pregnancy after vasectomy for men who have post-vasectomy semen analysis showing absent sperm or rare non-motile sperm. Typical side effects include a small amount of oozing blood, some discomfort and mild swelling in the area of incision.  Vasectomy does not affect sexual  performance, function, please, sensation, interest, desire, satisfaction, penile erection, volume of semen or ejaculation. Other rare risks include allergy or adverse reaction to an anesthetic, testicular atrophy, hematoma, infection/abscess, prolonged tenderness of the vas deferens, pain, swelling, painful nodule or scar (called sperm granuloma) or epididymtis. We discussed chronic testicular pain syndrome. This has been reported to occur in as many as 1-2% of men and may be permanent. This can be treated with medication, small procedures or (rarely) surgery. He indicated he would call back if he desires a preprocedure anxiolytic and would need a driver if utilizing   Geraline Knapp, MD  [facility] [address], [address] [place], [place] [address] [phone]

## 2023-09-18 ENCOUNTER — Encounter: Admitting: Urology

## 2023-09-30 ENCOUNTER — Encounter: Payer: Self-pay | Admitting: Urology

## 2023-09-30 ENCOUNTER — Ambulatory Visit: Admitting: Urology

## 2023-09-30 VITALS — BP 145/81 | HR 97 | Ht 68.0 in | Wt 215.0 lb

## 2023-09-30 DIAGNOSIS — Z302 Encounter for sterilization: Secondary | ICD-10-CM

## 2023-09-30 NOTE — Progress Notes (Signed)
   Vasectomy Procedure Note  Indications: Damon Moore is a 53 y.o. male who presents today for elective sterilization.  He has been consented for the procedure.  He is aware of the risks and benefits.  He had no additional questions.  He agrees to proceed.  He denies any other significant change since his last visit.  Pre-operative Diagnosis: Elective sterilization  Post-operative Diagnosis: Elective sterilization  Premedication: N/A  Surgeon: Glendia C. Siaosi Alter, M.D  Description: The patient was prepped and draped in the standard fashion.  The right vas deferens was identified and brought superiorly to the anterior scrotal skin.  The skin and vas were then anesthetized utilizing 8 ml 1% lidocaine.  A small stab incision was made and spread with the vas dissector.  The vas was grasped utilizing the vas clamp and elevated out of the incision.  The sheath was incised and the vas was dissected out of the sheath, elevated out of the incision and dissected free from the surrounding tissue and vessels.  A 1 cm segment was excised. The vas lumens were cauterized with a needle tip electrocautery for a distance of at least 1 cm  No significant bleeding was observed.  Fascial interposition was then performed on the proximal vasa with a 3-0 chromic figure-of-eight suture.  Hemostasis was excellent and the vas ends were then dropped back into the hemiscrotum.  The skin was closed with hemostatic pressure.  An identical procedure was performed on the contralateral side.  Clean dry gauze was applied to the incision sites.  The patient tolerated the procedure well.  Complications:None  Recommendations: 1.  No lifting greater than 10 pounds or strenuous activity for 1 week. 2.  Scrotal support for 1-2 weeks. 3.  May shower in 24 hours; no bath, hot tub for 1 week 4.  No intercourse for at least 7 days and resume based on level of discomfort  5.  Continue alternate contraception for 12 weeks.  6.  Call for  significant pain, swelling, redness, drainage or fever greater than 100.5. 7.  Decline narcotic analgesic and may take Tylenol or ibuprofen as needed for pain 8.  Follow-up semen analysis in 12 weeks.   Glendia Barba, MD

## 2023-09-30 NOTE — Patient Instructions (Signed)

## 2023-12-30 ENCOUNTER — Other Ambulatory Visit: Payer: Self-pay

## 2023-12-30 DIAGNOSIS — Z302 Encounter for sterilization: Secondary | ICD-10-CM

## 2023-12-31 ENCOUNTER — Other Ambulatory Visit

## 2023-12-31 DIAGNOSIS — Z302 Encounter for sterilization: Secondary | ICD-10-CM

## 2024-01-01 LAB — POST-VAS SPERM EVALUATION,QUAL: Volume: 0.5 mL

## 2024-01-02 ENCOUNTER — Ambulatory Visit: Payer: Self-pay | Admitting: Urology
